# Patient Record
Sex: Male | Born: 1955 | Hispanic: No | Marital: Single | Smoking: Former smoker
Health system: Southern US, Community
[De-identification: ages and names within clinical notes are randomized; demographics above are authoritative.]

## PROBLEM LIST (undated history)

## (undated) DIAGNOSIS — E1169 Type 2 diabetes mellitus with other specified complication: Secondary | ICD-10-CM

## (undated) DIAGNOSIS — Z6834 Body mass index (BMI) 34.0-34.9, adult: Secondary | ICD-10-CM

## (undated) DIAGNOSIS — I10 Essential (primary) hypertension: Secondary | ICD-10-CM

## (undated) DIAGNOSIS — Z66 Do not resuscitate: Secondary | ICD-10-CM

## (undated) DIAGNOSIS — G894 Chronic pain syndrome: Secondary | ICD-10-CM

## (undated) DIAGNOSIS — G808 Other cerebral palsy: Secondary | ICD-10-CM

## (undated) DIAGNOSIS — G40909 Epilepsy, unspecified, not intractable, without status epilepticus: Secondary | ICD-10-CM

## (undated) DIAGNOSIS — F418 Other specified anxiety disorders: Secondary | ICD-10-CM

## (undated) DIAGNOSIS — E669 Obesity, unspecified: Secondary | ICD-10-CM

## (undated) DIAGNOSIS — E66811 Obesity, class 1: Secondary | ICD-10-CM

## (undated) HISTORY — DX: Type 2 diabetes mellitus with other specified complication: E11.69

---

## 2016-02-06 DIAGNOSIS — G4700 Insomnia, unspecified: Secondary | ICD-10-CM | POA: Diagnosis not present

## 2016-02-06 DIAGNOSIS — G809 Cerebral palsy, unspecified: Secondary | ICD-10-CM | POA: Diagnosis not present

## 2016-02-06 DIAGNOSIS — I10 Essential (primary) hypertension: Secondary | ICD-10-CM | POA: Diagnosis not present

## 2016-02-06 DIAGNOSIS — M79674 Pain in right toe(s): Secondary | ICD-10-CM | POA: Diagnosis not present

## 2016-02-06 DIAGNOSIS — M4802 Spinal stenosis, cervical region: Secondary | ICD-10-CM | POA: Diagnosis not present

## 2016-02-06 DIAGNOSIS — F329 Major depressive disorder, single episode, unspecified: Secondary | ICD-10-CM | POA: Diagnosis not present

## 2016-02-06 DIAGNOSIS — B351 Tinea unguium: Secondary | ICD-10-CM | POA: Diagnosis not present

## 2016-02-06 DIAGNOSIS — G8929 Other chronic pain: Secondary | ICD-10-CM | POA: Diagnosis not present

## 2016-02-06 DIAGNOSIS — K219 Gastro-esophageal reflux disease without esophagitis: Secondary | ICD-10-CM | POA: Diagnosis not present

## 2016-02-06 DIAGNOSIS — J449 Chronic obstructive pulmonary disease, unspecified: Secondary | ICD-10-CM | POA: Diagnosis not present

## 2016-02-06 DIAGNOSIS — R05 Cough: Secondary | ICD-10-CM | POA: Diagnosis not present

## 2016-02-06 DIAGNOSIS — R0989 Other specified symptoms and signs involving the circulatory and respiratory systems: Secondary | ICD-10-CM | POA: Diagnosis not present

## 2016-02-06 DIAGNOSIS — L409 Psoriasis, unspecified: Secondary | ICD-10-CM | POA: Diagnosis not present

## 2016-02-06 DIAGNOSIS — Z4789 Encounter for other orthopedic aftercare: Secondary | ICD-10-CM | POA: Diagnosis not present

## 2016-02-06 DIAGNOSIS — F419 Anxiety disorder, unspecified: Secondary | ICD-10-CM | POA: Diagnosis not present

## 2016-02-06 DIAGNOSIS — R200 Anesthesia of skin: Secondary | ICD-10-CM | POA: Diagnosis not present

## 2016-02-06 DIAGNOSIS — Z87891 Personal history of nicotine dependence: Secondary | ICD-10-CM | POA: Diagnosis not present

## 2016-02-06 DIAGNOSIS — R262 Difficulty in walking, not elsewhere classified: Secondary | ICD-10-CM | POA: Diagnosis not present

## 2016-02-06 DIAGNOSIS — R509 Fever, unspecified: Secondary | ICD-10-CM | POA: Diagnosis not present

## 2016-02-06 DIAGNOSIS — Z09 Encounter for follow-up examination after completed treatment for conditions other than malignant neoplasm: Secondary | ICD-10-CM | POA: Diagnosis not present

## 2016-02-06 DIAGNOSIS — M62838 Other muscle spasm: Secondary | ICD-10-CM | POA: Diagnosis not present

## 2016-02-06 DIAGNOSIS — M79675 Pain in left toe(s): Secondary | ICD-10-CM | POA: Diagnosis not present

## 2016-02-06 DIAGNOSIS — M1990 Unspecified osteoarthritis, unspecified site: Secondary | ICD-10-CM | POA: Diagnosis not present

## 2016-02-09 DIAGNOSIS — G809 Cerebral palsy, unspecified: Secondary | ICD-10-CM | POA: Diagnosis not present

## 2016-02-09 DIAGNOSIS — M62838 Other muscle spasm: Secondary | ICD-10-CM | POA: Diagnosis not present

## 2016-02-09 DIAGNOSIS — M4802 Spinal stenosis, cervical region: Secondary | ICD-10-CM | POA: Diagnosis not present

## 2016-02-16 DIAGNOSIS — F329 Major depressive disorder, single episode, unspecified: Secondary | ICD-10-CM | POA: Diagnosis not present

## 2016-02-16 DIAGNOSIS — F419 Anxiety disorder, unspecified: Secondary | ICD-10-CM | POA: Diagnosis not present

## 2016-02-16 DIAGNOSIS — R200 Anesthesia of skin: Secondary | ICD-10-CM | POA: Diagnosis not present

## 2016-02-16 DIAGNOSIS — M62838 Other muscle spasm: Secondary | ICD-10-CM | POA: Diagnosis not present

## 2016-02-16 DIAGNOSIS — G4700 Insomnia, unspecified: Secondary | ICD-10-CM | POA: Diagnosis not present

## 2016-02-16 DIAGNOSIS — M4802 Spinal stenosis, cervical region: Secondary | ICD-10-CM | POA: Diagnosis not present

## 2016-02-16 DIAGNOSIS — G809 Cerebral palsy, unspecified: Secondary | ICD-10-CM | POA: Diagnosis not present

## 2016-02-20 DIAGNOSIS — G8929 Other chronic pain: Secondary | ICD-10-CM | POA: Diagnosis not present

## 2016-02-20 DIAGNOSIS — G809 Cerebral palsy, unspecified: Secondary | ICD-10-CM | POA: Diagnosis not present

## 2016-02-20 DIAGNOSIS — M62838 Other muscle spasm: Secondary | ICD-10-CM | POA: Diagnosis not present

## 2016-02-23 DIAGNOSIS — R0989 Other specified symptoms and signs involving the circulatory and respiratory systems: Secondary | ICD-10-CM | POA: Diagnosis not present

## 2016-02-23 DIAGNOSIS — R05 Cough: Secondary | ICD-10-CM | POA: Diagnosis not present

## 2016-02-23 DIAGNOSIS — G809 Cerebral palsy, unspecified: Secondary | ICD-10-CM | POA: Diagnosis not present

## 2016-02-23 DIAGNOSIS — Z87891 Personal history of nicotine dependence: Secondary | ICD-10-CM | POA: Diagnosis not present

## 2016-03-06 DIAGNOSIS — Z09 Encounter for follow-up examination after completed treatment for conditions other than malignant neoplasm: Secondary | ICD-10-CM | POA: Diagnosis not present

## 2016-03-12 DIAGNOSIS — M62838 Other muscle spasm: Secondary | ICD-10-CM | POA: Diagnosis not present

## 2016-03-12 DIAGNOSIS — G809 Cerebral palsy, unspecified: Secondary | ICD-10-CM | POA: Diagnosis not present

## 2016-03-12 DIAGNOSIS — G8929 Other chronic pain: Secondary | ICD-10-CM | POA: Diagnosis not present

## 2016-03-12 DIAGNOSIS — M4802 Spinal stenosis, cervical region: Secondary | ICD-10-CM | POA: Diagnosis not present

## 2016-03-26 DIAGNOSIS — G809 Cerebral palsy, unspecified: Secondary | ICD-10-CM | POA: Diagnosis not present

## 2016-03-26 DIAGNOSIS — M62838 Other muscle spasm: Secondary | ICD-10-CM | POA: Diagnosis not present

## 2016-04-02 DIAGNOSIS — M4802 Spinal stenosis, cervical region: Secondary | ICD-10-CM | POA: Diagnosis not present

## 2016-04-02 DIAGNOSIS — M62838 Other muscle spasm: Secondary | ICD-10-CM | POA: Diagnosis not present

## 2016-04-02 DIAGNOSIS — G8929 Other chronic pain: Secondary | ICD-10-CM | POA: Diagnosis not present

## 2016-04-02 DIAGNOSIS — G809 Cerebral palsy, unspecified: Secondary | ICD-10-CM | POA: Diagnosis not present

## 2016-04-16 DIAGNOSIS — G809 Cerebral palsy, unspecified: Secondary | ICD-10-CM | POA: Diagnosis not present

## 2016-04-16 DIAGNOSIS — M4802 Spinal stenosis, cervical region: Secondary | ICD-10-CM | POA: Diagnosis not present

## 2016-04-16 DIAGNOSIS — G8929 Other chronic pain: Secondary | ICD-10-CM | POA: Diagnosis not present

## 2016-04-17 DIAGNOSIS — F329 Major depressive disorder, single episode, unspecified: Secondary | ICD-10-CM | POA: Diagnosis not present

## 2016-04-17 DIAGNOSIS — F419 Anxiety disorder, unspecified: Secondary | ICD-10-CM | POA: Diagnosis not present

## 2016-04-23 DIAGNOSIS — M4802 Spinal stenosis, cervical region: Secondary | ICD-10-CM | POA: Diagnosis not present

## 2016-04-23 DIAGNOSIS — M62838 Other muscle spasm: Secondary | ICD-10-CM | POA: Diagnosis not present

## 2016-04-23 DIAGNOSIS — G809 Cerebral palsy, unspecified: Secondary | ICD-10-CM | POA: Diagnosis not present

## 2016-04-26 DIAGNOSIS — R05 Cough: Secondary | ICD-10-CM | POA: Diagnosis not present

## 2016-04-26 DIAGNOSIS — R5381 Other malaise: Secondary | ICD-10-CM | POA: Diagnosis not present

## 2016-04-26 DIAGNOSIS — R0989 Other specified symptoms and signs involving the circulatory and respiratory systems: Secondary | ICD-10-CM | POA: Diagnosis not present

## 2016-04-26 DIAGNOSIS — R0602 Shortness of breath: Secondary | ICD-10-CM | POA: Diagnosis not present

## 2016-05-08 DIAGNOSIS — F419 Anxiety disorder, unspecified: Secondary | ICD-10-CM | POA: Diagnosis not present

## 2016-05-08 DIAGNOSIS — F329 Major depressive disorder, single episode, unspecified: Secondary | ICD-10-CM | POA: Diagnosis not present

## 2016-05-09 DIAGNOSIS — R262 Difficulty in walking, not elsewhere classified: Secondary | ICD-10-CM | POA: Diagnosis not present

## 2016-05-09 DIAGNOSIS — M79675 Pain in left toe(s): Secondary | ICD-10-CM | POA: Diagnosis not present

## 2016-05-09 DIAGNOSIS — B351 Tinea unguium: Secondary | ICD-10-CM | POA: Diagnosis not present

## 2016-05-09 DIAGNOSIS — M79674 Pain in right toe(s): Secondary | ICD-10-CM | POA: Diagnosis not present

## 2016-05-10 DIAGNOSIS — G4700 Insomnia, unspecified: Secondary | ICD-10-CM | POA: Diagnosis not present

## 2016-05-10 DIAGNOSIS — Z961 Presence of intraocular lens: Secondary | ICD-10-CM | POA: Diagnosis not present

## 2016-05-10 DIAGNOSIS — F419 Anxiety disorder, unspecified: Secondary | ICD-10-CM | POA: Diagnosis not present

## 2016-05-10 DIAGNOSIS — F329 Major depressive disorder, single episode, unspecified: Secondary | ICD-10-CM | POA: Diagnosis not present

## 2016-05-22 DIAGNOSIS — F419 Anxiety disorder, unspecified: Secondary | ICD-10-CM | POA: Diagnosis not present

## 2016-05-22 DIAGNOSIS — F329 Major depressive disorder, single episode, unspecified: Secondary | ICD-10-CM | POA: Diagnosis not present

## 2016-05-29 DIAGNOSIS — M4802 Spinal stenosis, cervical region: Secondary | ICD-10-CM | POA: Diagnosis not present

## 2016-05-29 DIAGNOSIS — Z720 Tobacco use: Secondary | ICD-10-CM | POA: Diagnosis not present

## 2016-05-29 DIAGNOSIS — F329 Major depressive disorder, single episode, unspecified: Secondary | ICD-10-CM | POA: Diagnosis not present

## 2016-05-29 DIAGNOSIS — G8929 Other chronic pain: Secondary | ICD-10-CM | POA: Diagnosis not present

## 2016-05-31 DIAGNOSIS — M4802 Spinal stenosis, cervical region: Secondary | ICD-10-CM | POA: Diagnosis not present

## 2016-05-31 DIAGNOSIS — Z720 Tobacco use: Secondary | ICD-10-CM | POA: Diagnosis not present

## 2016-05-31 DIAGNOSIS — G8929 Other chronic pain: Secondary | ICD-10-CM | POA: Diagnosis not present

## 2016-05-31 DIAGNOSIS — M62838 Other muscle spasm: Secondary | ICD-10-CM | POA: Diagnosis not present

## 2016-06-04 DIAGNOSIS — G809 Cerebral palsy, unspecified: Secondary | ICD-10-CM | POA: Diagnosis not present

## 2016-06-04 DIAGNOSIS — M62838 Other muscle spasm: Secondary | ICD-10-CM | POA: Diagnosis not present

## 2016-06-04 DIAGNOSIS — Z720 Tobacco use: Secondary | ICD-10-CM | POA: Diagnosis not present

## 2016-06-05 DIAGNOSIS — F329 Major depressive disorder, single episode, unspecified: Secondary | ICD-10-CM | POA: Diagnosis not present

## 2016-06-05 DIAGNOSIS — F419 Anxiety disorder, unspecified: Secondary | ICD-10-CM | POA: Diagnosis not present

## 2016-06-11 DIAGNOSIS — G809 Cerebral palsy, unspecified: Secondary | ICD-10-CM | POA: Diagnosis not present

## 2016-06-11 DIAGNOSIS — M62838 Other muscle spasm: Secondary | ICD-10-CM | POA: Diagnosis not present

## 2016-06-11 DIAGNOSIS — M4802 Spinal stenosis, cervical region: Secondary | ICD-10-CM | POA: Diagnosis not present

## 2016-06-11 DIAGNOSIS — G8929 Other chronic pain: Secondary | ICD-10-CM | POA: Diagnosis not present

## 2016-06-18 DIAGNOSIS — Z9119 Patient's noncompliance with other medical treatment and regimen: Secondary | ICD-10-CM | POA: Diagnosis not present

## 2016-06-18 DIAGNOSIS — Z720 Tobacco use: Secondary | ICD-10-CM | POA: Diagnosis not present

## 2016-06-18 DIAGNOSIS — J449 Chronic obstructive pulmonary disease, unspecified: Secondary | ICD-10-CM | POA: Diagnosis not present

## 2016-06-21 DIAGNOSIS — F329 Major depressive disorder, single episode, unspecified: Secondary | ICD-10-CM | POA: Diagnosis not present

## 2016-06-21 DIAGNOSIS — G4700 Insomnia, unspecified: Secondary | ICD-10-CM | POA: Diagnosis not present

## 2016-06-21 DIAGNOSIS — F419 Anxiety disorder, unspecified: Secondary | ICD-10-CM | POA: Diagnosis not present

## 2016-06-26 DIAGNOSIS — M542 Cervicalgia: Secondary | ICD-10-CM | POA: Diagnosis not present

## 2016-06-26 DIAGNOSIS — G959 Disease of spinal cord, unspecified: Secondary | ICD-10-CM | POA: Diagnosis not present

## 2016-07-04 DIAGNOSIS — F419 Anxiety disorder, unspecified: Secondary | ICD-10-CM | POA: Diagnosis not present

## 2016-07-04 DIAGNOSIS — F329 Major depressive disorder, single episode, unspecified: Secondary | ICD-10-CM | POA: Diagnosis not present

## 2016-07-10 DIAGNOSIS — I10 Essential (primary) hypertension: Secondary | ICD-10-CM | POA: Diagnosis not present

## 2016-07-16 DIAGNOSIS — G809 Cerebral palsy, unspecified: Secondary | ICD-10-CM | POA: Diagnosis not present

## 2016-07-16 DIAGNOSIS — M4802 Spinal stenosis, cervical region: Secondary | ICD-10-CM | POA: Diagnosis not present

## 2016-07-16 DIAGNOSIS — M62838 Other muscle spasm: Secondary | ICD-10-CM | POA: Diagnosis not present

## 2016-07-16 DIAGNOSIS — G8929 Other chronic pain: Secondary | ICD-10-CM | POA: Diagnosis not present

## 2016-07-19 DIAGNOSIS — L209 Atopic dermatitis, unspecified: Secondary | ICD-10-CM | POA: Diagnosis not present

## 2016-07-24 DIAGNOSIS — F329 Major depressive disorder, single episode, unspecified: Secondary | ICD-10-CM | POA: Diagnosis not present

## 2016-07-24 DIAGNOSIS — F419 Anxiety disorder, unspecified: Secondary | ICD-10-CM | POA: Diagnosis not present

## 2016-07-26 DIAGNOSIS — L739 Follicular disorder, unspecified: Secondary | ICD-10-CM | POA: Diagnosis not present

## 2016-08-02 DIAGNOSIS — F419 Anxiety disorder, unspecified: Secondary | ICD-10-CM | POA: Diagnosis not present

## 2016-08-02 DIAGNOSIS — M62838 Other muscle spasm: Secondary | ICD-10-CM | POA: Diagnosis not present

## 2016-08-02 DIAGNOSIS — G4700 Insomnia, unspecified: Secondary | ICD-10-CM | POA: Diagnosis not present

## 2016-08-02 DIAGNOSIS — R21 Rash and other nonspecific skin eruption: Secondary | ICD-10-CM | POA: Diagnosis not present

## 2016-08-02 DIAGNOSIS — F329 Major depressive disorder, single episode, unspecified: Secondary | ICD-10-CM | POA: Diagnosis not present

## 2016-08-07 DIAGNOSIS — J449 Chronic obstructive pulmonary disease, unspecified: Secondary | ICD-10-CM | POA: Diagnosis not present

## 2016-08-07 DIAGNOSIS — I10 Essential (primary) hypertension: Secondary | ICD-10-CM | POA: Diagnosis not present

## 2016-08-07 DIAGNOSIS — F419 Anxiety disorder, unspecified: Secondary | ICD-10-CM | POA: Diagnosis not present

## 2016-08-07 DIAGNOSIS — F329 Major depressive disorder, single episode, unspecified: Secondary | ICD-10-CM | POA: Diagnosis not present

## 2016-08-21 DIAGNOSIS — F329 Major depressive disorder, single episode, unspecified: Secondary | ICD-10-CM | POA: Diagnosis not present

## 2016-08-21 DIAGNOSIS — F419 Anxiety disorder, unspecified: Secondary | ICD-10-CM | POA: Diagnosis not present

## 2016-08-29 DIAGNOSIS — I10 Essential (primary) hypertension: Secondary | ICD-10-CM | POA: Diagnosis not present

## 2016-09-12 DIAGNOSIS — F329 Major depressive disorder, single episode, unspecified: Secondary | ICD-10-CM | POA: Diagnosis not present

## 2016-09-12 DIAGNOSIS — F419 Anxiety disorder, unspecified: Secondary | ICD-10-CM | POA: Diagnosis not present

## 2016-09-12 DIAGNOSIS — G4700 Insomnia, unspecified: Secondary | ICD-10-CM | POA: Diagnosis not present

## 2016-09-18 DIAGNOSIS — F330 Major depressive disorder, recurrent, mild: Secondary | ICD-10-CM | POA: Diagnosis not present

## 2016-09-18 DIAGNOSIS — F419 Anxiety disorder, unspecified: Secondary | ICD-10-CM | POA: Diagnosis not present

## 2016-10-09 DIAGNOSIS — F419 Anxiety disorder, unspecified: Secondary | ICD-10-CM | POA: Diagnosis not present

## 2016-10-09 DIAGNOSIS — F330 Major depressive disorder, recurrent, mild: Secondary | ICD-10-CM | POA: Diagnosis not present

## 2016-10-09 DIAGNOSIS — Z043 Encounter for examination and observation following other accident: Secondary | ICD-10-CM | POA: Diagnosis not present

## 2016-11-09 DIAGNOSIS — F419 Anxiety disorder, unspecified: Secondary | ICD-10-CM | POA: Diagnosis not present

## 2016-11-09 DIAGNOSIS — F330 Major depressive disorder, recurrent, mild: Secondary | ICD-10-CM | POA: Diagnosis not present

## 2016-11-09 DIAGNOSIS — G4700 Insomnia, unspecified: Secondary | ICD-10-CM | POA: Diagnosis not present

## 2016-11-12 DIAGNOSIS — G4700 Insomnia, unspecified: Secondary | ICD-10-CM | POA: Diagnosis not present

## 2016-11-12 DIAGNOSIS — F329 Major depressive disorder, single episode, unspecified: Secondary | ICD-10-CM | POA: Diagnosis not present

## 2016-11-12 DIAGNOSIS — K219 Gastro-esophageal reflux disease without esophagitis: Secondary | ICD-10-CM | POA: Diagnosis not present

## 2016-11-12 DIAGNOSIS — G809 Cerebral palsy, unspecified: Secondary | ICD-10-CM | POA: Diagnosis not present

## 2016-11-12 DIAGNOSIS — J449 Chronic obstructive pulmonary disease, unspecified: Secondary | ICD-10-CM | POA: Diagnosis not present

## 2016-11-12 DIAGNOSIS — F419 Anxiety disorder, unspecified: Secondary | ICD-10-CM | POA: Diagnosis not present

## 2016-11-12 DIAGNOSIS — I10 Essential (primary) hypertension: Secondary | ICD-10-CM | POA: Diagnosis not present

## 2016-11-12 DIAGNOSIS — M4802 Spinal stenosis, cervical region: Secondary | ICD-10-CM | POA: Diagnosis not present

## 2016-11-13 DIAGNOSIS — F419 Anxiety disorder, unspecified: Secondary | ICD-10-CM | POA: Diagnosis not present

## 2016-11-13 DIAGNOSIS — F330 Major depressive disorder, recurrent, mild: Secondary | ICD-10-CM | POA: Diagnosis not present

## 2016-11-26 DIAGNOSIS — B86 Scabies: Secondary | ICD-10-CM | POA: Diagnosis not present

## 2016-11-26 DIAGNOSIS — R531 Weakness: Secondary | ICD-10-CM | POA: Diagnosis not present

## 2016-11-26 DIAGNOSIS — R279 Unspecified lack of coordination: Secondary | ICD-10-CM | POA: Diagnosis not present

## 2016-11-26 DIAGNOSIS — R21 Rash and other nonspecific skin eruption: Secondary | ICD-10-CM | POA: Diagnosis not present

## 2016-12-04 DIAGNOSIS — F419 Anxiety disorder, unspecified: Secondary | ICD-10-CM | POA: Diagnosis not present

## 2016-12-04 DIAGNOSIS — F330 Major depressive disorder, recurrent, mild: Secondary | ICD-10-CM | POA: Diagnosis not present

## 2016-12-18 DIAGNOSIS — F330 Major depressive disorder, recurrent, mild: Secondary | ICD-10-CM | POA: Diagnosis not present

## 2016-12-18 DIAGNOSIS — F419 Anxiety disorder, unspecified: Secondary | ICD-10-CM | POA: Diagnosis not present

## 2016-12-19 DIAGNOSIS — R279 Unspecified lack of coordination: Secondary | ICD-10-CM | POA: Diagnosis not present

## 2016-12-19 DIAGNOSIS — B86 Scabies: Secondary | ICD-10-CM | POA: Diagnosis not present

## 2016-12-19 DIAGNOSIS — R531 Weakness: Secondary | ICD-10-CM | POA: Diagnosis not present

## 2017-01-07 DIAGNOSIS — I10 Essential (primary) hypertension: Secondary | ICD-10-CM | POA: Diagnosis not present

## 2017-01-12 DIAGNOSIS — R062 Wheezing: Secondary | ICD-10-CM | POA: Diagnosis not present

## 2017-01-15 DIAGNOSIS — L218 Other seborrheic dermatitis: Secondary | ICD-10-CM | POA: Diagnosis not present

## 2017-01-15 DIAGNOSIS — R279 Unspecified lack of coordination: Secondary | ICD-10-CM | POA: Diagnosis not present

## 2017-01-15 DIAGNOSIS — R531 Weakness: Secondary | ICD-10-CM | POA: Diagnosis not present

## 2017-01-15 DIAGNOSIS — B86 Scabies: Secondary | ICD-10-CM | POA: Diagnosis not present

## 2017-01-25 DIAGNOSIS — Z7401 Bed confinement status: Secondary | ICD-10-CM | POA: Diagnosis not present

## 2017-01-25 DIAGNOSIS — R52 Pain, unspecified: Secondary | ICD-10-CM | POA: Diagnosis not present

## 2017-01-25 DIAGNOSIS — G8220 Paraplegia, unspecified: Secondary | ICD-10-CM | POA: Diagnosis not present

## 2017-01-31 DIAGNOSIS — R5383 Other fatigue: Secondary | ICD-10-CM | POA: Diagnosis not present

## 2017-01-31 DIAGNOSIS — R4182 Altered mental status, unspecified: Secondary | ICD-10-CM | POA: Diagnosis not present

## 2017-01-31 DIAGNOSIS — R0602 Shortness of breath: Secondary | ICD-10-CM | POA: Diagnosis not present

## 2017-01-31 DIAGNOSIS — R001 Bradycardia, unspecified: Secondary | ICD-10-CM | POA: Diagnosis not present

## 2017-01-31 DIAGNOSIS — Z981 Arthrodesis status: Secondary | ICD-10-CM | POA: Diagnosis not present

## 2017-01-31 DIAGNOSIS — R7981 Abnormal blood-gas level: Secondary | ICD-10-CM | POA: Diagnosis not present

## 2017-02-01 DIAGNOSIS — R4182 Altered mental status, unspecified: Secondary | ICD-10-CM | POA: Diagnosis not present

## 2017-02-01 DIAGNOSIS — R031 Nonspecific low blood-pressure reading: Secondary | ICD-10-CM | POA: Diagnosis not present

## 2017-02-01 DIAGNOSIS — N179 Acute kidney failure, unspecified: Secondary | ICD-10-CM | POA: Diagnosis not present

## 2017-02-01 DIAGNOSIS — E860 Dehydration: Secondary | ICD-10-CM | POA: Diagnosis not present

## 2017-02-01 DIAGNOSIS — J9601 Acute respiratory failure with hypoxia: Secondary | ICD-10-CM | POA: Diagnosis not present

## 2017-02-01 DIAGNOSIS — E875 Hyperkalemia: Secondary | ICD-10-CM | POA: Diagnosis not present

## 2017-02-01 DIAGNOSIS — I959 Hypotension, unspecified: Secondary | ICD-10-CM | POA: Diagnosis not present

## 2017-02-03 DIAGNOSIS — G808 Other cerebral palsy: Secondary | ICD-10-CM | POA: Diagnosis not present

## 2017-02-03 DIAGNOSIS — J9602 Acute respiratory failure with hypercapnia: Secondary | ICD-10-CM | POA: Diagnosis not present

## 2017-02-03 DIAGNOSIS — K219 Gastro-esophageal reflux disease without esophagitis: Secondary | ICD-10-CM | POA: Diagnosis not present

## 2017-02-03 DIAGNOSIS — G959 Disease of spinal cord, unspecified: Secondary | ICD-10-CM | POA: Diagnosis not present

## 2017-02-03 DIAGNOSIS — I10 Essential (primary) hypertension: Secondary | ICD-10-CM | POA: Diagnosis not present

## 2017-02-03 DIAGNOSIS — N179 Acute kidney failure, unspecified: Secondary | ICD-10-CM | POA: Diagnosis not present

## 2017-02-04 DIAGNOSIS — N179 Acute kidney failure, unspecified: Secondary | ICD-10-CM | POA: Diagnosis not present

## 2017-02-04 DIAGNOSIS — G808 Other cerebral palsy: Secondary | ICD-10-CM | POA: Diagnosis not present

## 2017-02-04 DIAGNOSIS — I10 Essential (primary) hypertension: Secondary | ICD-10-CM | POA: Diagnosis not present

## 2017-02-04 DIAGNOSIS — K219 Gastro-esophageal reflux disease without esophagitis: Secondary | ICD-10-CM | POA: Diagnosis not present

## 2017-02-04 DIAGNOSIS — J9602 Acute respiratory failure with hypercapnia: Secondary | ICD-10-CM | POA: Diagnosis not present

## 2017-02-04 DIAGNOSIS — M5000 Cervical disc disorder with myelopathy, unspecified cervical region: Secondary | ICD-10-CM | POA: Diagnosis not present

## 2017-02-05 DIAGNOSIS — R251 Tremor, unspecified: Secondary | ICD-10-CM | POA: Diagnosis not present

## 2017-02-05 DIAGNOSIS — K5901 Slow transit constipation: Secondary | ICD-10-CM | POA: Diagnosis not present

## 2017-02-05 DIAGNOSIS — F17210 Nicotine dependence, cigarettes, uncomplicated: Secondary | ICD-10-CM | POA: Diagnosis not present

## 2017-02-05 DIAGNOSIS — I10 Essential (primary) hypertension: Secondary | ICD-10-CM | POA: Diagnosis not present

## 2017-02-05 DIAGNOSIS — J441 Chronic obstructive pulmonary disease with (acute) exacerbation: Secondary | ICD-10-CM | POA: Diagnosis not present

## 2017-02-05 DIAGNOSIS — R1310 Dysphagia, unspecified: Secondary | ICD-10-CM | POA: Diagnosis not present

## 2017-02-05 DIAGNOSIS — G801 Spastic diplegic cerebral palsy: Secondary | ICD-10-CM | POA: Diagnosis not present

## 2017-02-05 DIAGNOSIS — N179 Acute kidney failure, unspecified: Secondary | ICD-10-CM | POA: Diagnosis not present

## 2017-02-05 DIAGNOSIS — K219 Gastro-esophageal reflux disease without esophagitis: Secondary | ICD-10-CM | POA: Diagnosis not present

## 2017-02-05 DIAGNOSIS — G969 Disorder of central nervous system, unspecified: Secondary | ICD-10-CM | POA: Diagnosis not present

## 2017-02-05 DIAGNOSIS — G809 Cerebral palsy, unspecified: Secondary | ICD-10-CM | POA: Diagnosis not present

## 2017-02-05 DIAGNOSIS — J9602 Acute respiratory failure with hypercapnia: Secondary | ICD-10-CM | POA: Diagnosis not present

## 2017-02-06 DIAGNOSIS — J441 Chronic obstructive pulmonary disease with (acute) exacerbation: Secondary | ICD-10-CM | POA: Diagnosis not present

## 2017-02-06 DIAGNOSIS — K219 Gastro-esophageal reflux disease without esophagitis: Secondary | ICD-10-CM | POA: Diagnosis not present

## 2017-02-06 DIAGNOSIS — K5901 Slow transit constipation: Secondary | ICD-10-CM | POA: Diagnosis not present

## 2017-02-06 DIAGNOSIS — J9602 Acute respiratory failure with hypercapnia: Secondary | ICD-10-CM | POA: Diagnosis not present

## 2017-02-06 DIAGNOSIS — I10 Essential (primary) hypertension: Secondary | ICD-10-CM | POA: Diagnosis not present

## 2017-02-06 DIAGNOSIS — F17210 Nicotine dependence, cigarettes, uncomplicated: Secondary | ICD-10-CM | POA: Diagnosis not present

## 2017-02-06 DIAGNOSIS — N179 Acute kidney failure, unspecified: Secondary | ICD-10-CM | POA: Diagnosis not present

## 2017-02-06 DIAGNOSIS — R1310 Dysphagia, unspecified: Secondary | ICD-10-CM | POA: Diagnosis not present

## 2017-02-06 DIAGNOSIS — G809 Cerebral palsy, unspecified: Secondary | ICD-10-CM | POA: Diagnosis not present

## 2017-02-07 DIAGNOSIS — K5901 Slow transit constipation: Secondary | ICD-10-CM | POA: Diagnosis not present

## 2017-02-07 DIAGNOSIS — I10 Essential (primary) hypertension: Secondary | ICD-10-CM | POA: Diagnosis not present

## 2017-02-07 DIAGNOSIS — J441 Chronic obstructive pulmonary disease with (acute) exacerbation: Secondary | ICD-10-CM | POA: Diagnosis not present

## 2017-02-07 DIAGNOSIS — G809 Cerebral palsy, unspecified: Secondary | ICD-10-CM | POA: Diagnosis not present

## 2017-02-07 DIAGNOSIS — N179 Acute kidney failure, unspecified: Secondary | ICD-10-CM | POA: Diagnosis not present

## 2017-02-07 DIAGNOSIS — R1310 Dysphagia, unspecified: Secondary | ICD-10-CM | POA: Diagnosis not present

## 2017-02-07 DIAGNOSIS — F17210 Nicotine dependence, cigarettes, uncomplicated: Secondary | ICD-10-CM | POA: Diagnosis not present

## 2017-02-07 DIAGNOSIS — K219 Gastro-esophageal reflux disease without esophagitis: Secondary | ICD-10-CM | POA: Diagnosis not present

## 2017-02-07 DIAGNOSIS — J9602 Acute respiratory failure with hypercapnia: Secondary | ICD-10-CM | POA: Diagnosis not present

## 2017-02-08 DIAGNOSIS — I10 Essential (primary) hypertension: Secondary | ICD-10-CM | POA: Diagnosis not present

## 2017-02-08 DIAGNOSIS — K5901 Slow transit constipation: Secondary | ICD-10-CM | POA: Diagnosis not present

## 2017-02-08 DIAGNOSIS — F17210 Nicotine dependence, cigarettes, uncomplicated: Secondary | ICD-10-CM | POA: Diagnosis not present

## 2017-02-08 DIAGNOSIS — R1310 Dysphagia, unspecified: Secondary | ICD-10-CM | POA: Diagnosis not present

## 2017-02-08 DIAGNOSIS — K219 Gastro-esophageal reflux disease without esophagitis: Secondary | ICD-10-CM | POA: Diagnosis not present

## 2017-02-08 DIAGNOSIS — J441 Chronic obstructive pulmonary disease with (acute) exacerbation: Secondary | ICD-10-CM | POA: Diagnosis not present

## 2017-02-08 DIAGNOSIS — G809 Cerebral palsy, unspecified: Secondary | ICD-10-CM | POA: Diagnosis not present

## 2017-02-08 DIAGNOSIS — N179 Acute kidney failure, unspecified: Secondary | ICD-10-CM | POA: Diagnosis not present

## 2017-02-08 DIAGNOSIS — J9602 Acute respiratory failure with hypercapnia: Secondary | ICD-10-CM | POA: Diagnosis not present

## 2017-02-09 DIAGNOSIS — R279 Unspecified lack of coordination: Secondary | ICD-10-CM | POA: Diagnosis not present

## 2017-02-09 DIAGNOSIS — R1310 Dysphagia, unspecified: Secondary | ICD-10-CM | POA: Diagnosis not present

## 2017-02-09 DIAGNOSIS — J9602 Acute respiratory failure with hypercapnia: Secondary | ICD-10-CM | POA: Diagnosis not present

## 2017-02-09 DIAGNOSIS — G809 Cerebral palsy, unspecified: Secondary | ICD-10-CM | POA: Diagnosis not present

## 2017-02-09 DIAGNOSIS — J441 Chronic obstructive pulmonary disease with (acute) exacerbation: Secondary | ICD-10-CM | POA: Diagnosis not present

## 2017-02-09 DIAGNOSIS — Z743 Need for continuous supervision: Secondary | ICD-10-CM | POA: Diagnosis not present

## 2017-02-09 DIAGNOSIS — F17210 Nicotine dependence, cigarettes, uncomplicated: Secondary | ICD-10-CM | POA: Diagnosis not present

## 2017-02-09 DIAGNOSIS — I10 Essential (primary) hypertension: Secondary | ICD-10-CM | POA: Diagnosis not present

## 2017-02-09 DIAGNOSIS — K219 Gastro-esophageal reflux disease without esophagitis: Secondary | ICD-10-CM | POA: Diagnosis not present

## 2017-02-09 DIAGNOSIS — N179 Acute kidney failure, unspecified: Secondary | ICD-10-CM | POA: Diagnosis not present

## 2017-02-09 DIAGNOSIS — K5901 Slow transit constipation: Secondary | ICD-10-CM | POA: Diagnosis not present

## 2017-03-04 DIAGNOSIS — B351 Tinea unguium: Secondary | ICD-10-CM | POA: Diagnosis not present

## 2017-03-04 DIAGNOSIS — G809 Cerebral palsy, unspecified: Secondary | ICD-10-CM | POA: Diagnosis not present

## 2017-03-04 DIAGNOSIS — R262 Difficulty in walking, not elsewhere classified: Secondary | ICD-10-CM | POA: Diagnosis not present

## 2017-03-26 DIAGNOSIS — G959 Disease of spinal cord, unspecified: Secondary | ICD-10-CM | POA: Diagnosis not present

## 2017-03-26 DIAGNOSIS — G253 Myoclonus: Secondary | ICD-10-CM | POA: Diagnosis not present

## 2017-03-26 DIAGNOSIS — G808 Other cerebral palsy: Secondary | ICD-10-CM | POA: Diagnosis not present

## 2017-05-17 DIAGNOSIS — G253 Myoclonus: Secondary | ICD-10-CM | POA: Diagnosis not present

## 2017-06-07 DIAGNOSIS — R531 Weakness: Secondary | ICD-10-CM | POA: Diagnosis not present

## 2017-06-07 DIAGNOSIS — R52 Pain, unspecified: Secondary | ICD-10-CM | POA: Diagnosis not present

## 2017-06-07 DIAGNOSIS — R269 Unspecified abnormalities of gait and mobility: Secondary | ICD-10-CM | POA: Diagnosis not present

## 2017-10-13 DIAGNOSIS — J449 Chronic obstructive pulmonary disease, unspecified: Secondary | ICD-10-CM | POA: Diagnosis not present

## 2017-10-13 DIAGNOSIS — Z9911 Dependence on respirator [ventilator] status: Secondary | ICD-10-CM | POA: Diagnosis not present

## 2017-10-13 DIAGNOSIS — Z993 Dependence on wheelchair: Secondary | ICD-10-CM | POA: Diagnosis not present

## 2017-10-13 DIAGNOSIS — L89151 Pressure ulcer of sacral region, stage 1: Secondary | ICD-10-CM | POA: Diagnosis not present

## 2017-10-13 DIAGNOSIS — A419 Sepsis, unspecified organism: Secondary | ICD-10-CM | POA: Diagnosis not present

## 2017-10-13 DIAGNOSIS — D72825 Bandemia: Secondary | ICD-10-CM | POA: Diagnosis not present

## 2017-10-13 DIAGNOSIS — G809 Cerebral palsy, unspecified: Secondary | ICD-10-CM | POA: Diagnosis not present

## 2017-10-13 DIAGNOSIS — J9602 Acute respiratory failure with hypercapnia: Secondary | ICD-10-CM | POA: Diagnosis not present

## 2017-10-13 DIAGNOSIS — L03119 Cellulitis of unspecified part of limb: Secondary | ICD-10-CM | POA: Diagnosis not present

## 2017-10-13 DIAGNOSIS — J181 Lobar pneumonia, unspecified organism: Secondary | ICD-10-CM | POA: Diagnosis not present

## 2017-10-13 DIAGNOSIS — R6520 Severe sepsis without septic shock: Secondary | ICD-10-CM | POA: Diagnosis not present

## 2017-10-13 DIAGNOSIS — R4182 Altered mental status, unspecified: Secondary | ICD-10-CM | POA: Diagnosis not present

## 2017-10-13 DIAGNOSIS — R6521 Severe sepsis with septic shock: Secondary | ICD-10-CM | POA: Diagnosis not present

## 2017-10-13 DIAGNOSIS — J9690 Respiratory failure, unspecified, unspecified whether with hypoxia or hypercapnia: Secondary | ICD-10-CM | POA: Diagnosis not present

## 2017-10-20 DIAGNOSIS — J9601 Acute respiratory failure with hypoxia: Secondary | ICD-10-CM | POA: Diagnosis not present

## 2017-10-20 DIAGNOSIS — A419 Sepsis, unspecified organism: Secondary | ICD-10-CM | POA: Diagnosis not present

## 2017-10-20 DIAGNOSIS — G808 Other cerebral palsy: Secondary | ICD-10-CM | POA: Diagnosis not present

## 2017-10-20 DIAGNOSIS — J189 Pneumonia, unspecified organism: Secondary | ICD-10-CM | POA: Diagnosis not present

## 2017-10-20 DIAGNOSIS — R6521 Severe sepsis with septic shock: Secondary | ICD-10-CM | POA: Diagnosis not present

## 2017-10-20 DIAGNOSIS — Z9911 Dependence on respirator [ventilator] status: Secondary | ICD-10-CM | POA: Diagnosis not present

## 2017-10-24 DIAGNOSIS — Z9981 Dependence on supplemental oxygen: Secondary | ICD-10-CM | POA: Diagnosis not present

## 2017-10-24 DIAGNOSIS — J9600 Acute respiratory failure, unspecified whether with hypoxia or hypercapnia: Secondary | ICD-10-CM | POA: Diagnosis not present

## 2017-10-24 DIAGNOSIS — G809 Cerebral palsy, unspecified: Secondary | ICD-10-CM | POA: Diagnosis not present

## 2017-10-24 DIAGNOSIS — J9602 Acute respiratory failure with hypercapnia: Secondary | ICD-10-CM | POA: Diagnosis not present

## 2017-10-24 DIAGNOSIS — Z66 Do not resuscitate: Secondary | ICD-10-CM | POA: Diagnosis not present

## 2017-10-24 DIAGNOSIS — Z993 Dependence on wheelchair: Secondary | ICD-10-CM | POA: Diagnosis not present

## 2017-10-24 DIAGNOSIS — J449 Chronic obstructive pulmonary disease, unspecified: Secondary | ICD-10-CM | POA: Diagnosis not present

## 2017-10-24 DIAGNOSIS — A419 Sepsis, unspecified organism: Secondary | ICD-10-CM | POA: Diagnosis not present

## 2017-10-24 DIAGNOSIS — R6521 Severe sepsis with septic shock: Secondary | ICD-10-CM | POA: Diagnosis not present

## 2017-10-25 DIAGNOSIS — J9809 Other diseases of bronchus, not elsewhere classified: Secondary | ICD-10-CM | POA: Diagnosis not present

## 2017-10-25 DIAGNOSIS — Z9911 Dependence on respirator [ventilator] status: Secondary | ICD-10-CM | POA: Diagnosis not present

## 2017-10-25 DIAGNOSIS — G8929 Other chronic pain: Secondary | ICD-10-CM | POA: Diagnosis not present

## 2017-10-25 DIAGNOSIS — Z452 Encounter for adjustment and management of vascular access device: Secondary | ICD-10-CM | POA: Diagnosis not present

## 2017-10-25 DIAGNOSIS — F411 Generalized anxiety disorder: Secondary | ICD-10-CM | POA: Diagnosis not present

## 2017-10-25 DIAGNOSIS — J449 Chronic obstructive pulmonary disease, unspecified: Secondary | ICD-10-CM | POA: Diagnosis not present

## 2017-10-25 DIAGNOSIS — F17210 Nicotine dependence, cigarettes, uncomplicated: Secondary | ICD-10-CM | POA: Diagnosis not present

## 2017-10-25 DIAGNOSIS — I10 Essential (primary) hypertension: Secondary | ICD-10-CM | POA: Diagnosis not present

## 2017-10-25 DIAGNOSIS — J9601 Acute respiratory failure with hypoxia: Secondary | ICD-10-CM | POA: Diagnosis not present

## 2017-10-25 DIAGNOSIS — J189 Pneumonia, unspecified organism: Secondary | ICD-10-CM | POA: Diagnosis not present

## 2017-10-26 DIAGNOSIS — J9601 Acute respiratory failure with hypoxia: Secondary | ICD-10-CM | POA: Diagnosis not present

## 2017-10-26 DIAGNOSIS — K5900 Constipation, unspecified: Secondary | ICD-10-CM | POA: Diagnosis not present

## 2017-10-26 DIAGNOSIS — A419 Sepsis, unspecified organism: Secondary | ICD-10-CM | POA: Diagnosis not present

## 2017-10-26 DIAGNOSIS — J189 Pneumonia, unspecified organism: Secondary | ICD-10-CM | POA: Diagnosis not present

## 2017-10-26 DIAGNOSIS — F411 Generalized anxiety disorder: Secondary | ICD-10-CM | POA: Diagnosis not present

## 2017-10-26 DIAGNOSIS — F17210 Nicotine dependence, cigarettes, uncomplicated: Secondary | ICD-10-CM | POA: Diagnosis not present

## 2017-10-26 DIAGNOSIS — J9809 Other diseases of bronchus, not elsewhere classified: Secondary | ICD-10-CM | POA: Diagnosis not present

## 2017-10-26 DIAGNOSIS — J9602 Acute respiratory failure with hypercapnia: Secondary | ICD-10-CM | POA: Diagnosis not present

## 2017-10-26 DIAGNOSIS — Z9911 Dependence on respirator [ventilator] status: Secondary | ICD-10-CM | POA: Diagnosis not present

## 2017-10-26 DIAGNOSIS — G809 Cerebral palsy, unspecified: Secondary | ICD-10-CM | POA: Diagnosis not present

## 2017-10-26 DIAGNOSIS — R6521 Severe sepsis with septic shock: Secondary | ICD-10-CM | POA: Diagnosis not present

## 2017-10-26 DIAGNOSIS — I10 Essential (primary) hypertension: Secondary | ICD-10-CM | POA: Diagnosis not present

## 2017-10-26 DIAGNOSIS — E119 Type 2 diabetes mellitus without complications: Secondary | ICD-10-CM | POA: Diagnosis not present

## 2017-10-27 DIAGNOSIS — G809 Cerebral palsy, unspecified: Secondary | ICD-10-CM | POA: Diagnosis not present

## 2017-10-27 DIAGNOSIS — J9601 Acute respiratory failure with hypoxia: Secondary | ICD-10-CM | POA: Diagnosis not present

## 2017-10-27 DIAGNOSIS — K5900 Constipation, unspecified: Secondary | ICD-10-CM | POA: Diagnosis not present

## 2017-10-27 DIAGNOSIS — J449 Chronic obstructive pulmonary disease, unspecified: Secondary | ICD-10-CM | POA: Diagnosis not present

## 2017-10-27 DIAGNOSIS — F411 Generalized anxiety disorder: Secondary | ICD-10-CM | POA: Diagnosis not present

## 2017-10-27 DIAGNOSIS — F17210 Nicotine dependence, cigarettes, uncomplicated: Secondary | ICD-10-CM | POA: Diagnosis not present

## 2017-10-27 DIAGNOSIS — J9809 Other diseases of bronchus, not elsewhere classified: Secondary | ICD-10-CM | POA: Diagnosis not present

## 2017-10-27 DIAGNOSIS — J189 Pneumonia, unspecified organism: Secondary | ICD-10-CM | POA: Diagnosis not present

## 2017-10-27 DIAGNOSIS — E119 Type 2 diabetes mellitus without complications: Secondary | ICD-10-CM | POA: Diagnosis not present

## 2017-10-27 DIAGNOSIS — B965 Pseudomonas (aeruginosa) (mallei) (pseudomallei) as the cause of diseases classified elsewhere: Secondary | ICD-10-CM | POA: Diagnosis not present

## 2017-10-27 DIAGNOSIS — Z9911 Dependence on respirator [ventilator] status: Secondary | ICD-10-CM | POA: Diagnosis not present

## 2017-10-27 DIAGNOSIS — I10 Essential (primary) hypertension: Secondary | ICD-10-CM | POA: Diagnosis not present

## 2017-10-28 DIAGNOSIS — J9809 Other diseases of bronchus, not elsewhere classified: Secondary | ICD-10-CM | POA: Diagnosis not present

## 2017-10-28 DIAGNOSIS — F17210 Nicotine dependence, cigarettes, uncomplicated: Secondary | ICD-10-CM | POA: Diagnosis not present

## 2017-10-28 DIAGNOSIS — Z794 Long term (current) use of insulin: Secondary | ICD-10-CM | POA: Diagnosis not present

## 2017-10-28 DIAGNOSIS — F411 Generalized anxiety disorder: Secondary | ICD-10-CM | POA: Diagnosis not present

## 2017-10-28 DIAGNOSIS — R6521 Severe sepsis with septic shock: Secondary | ICD-10-CM | POA: Diagnosis not present

## 2017-10-28 DIAGNOSIS — J151 Pneumonia due to Pseudomonas: Secondary | ICD-10-CM | POA: Diagnosis not present

## 2017-10-28 DIAGNOSIS — G809 Cerebral palsy, unspecified: Secondary | ICD-10-CM | POA: Diagnosis not present

## 2017-10-28 DIAGNOSIS — E119 Type 2 diabetes mellitus without complications: Secondary | ICD-10-CM | POA: Diagnosis not present

## 2017-10-28 DIAGNOSIS — A419 Sepsis, unspecified organism: Secondary | ICD-10-CM | POA: Diagnosis not present

## 2017-10-28 DIAGNOSIS — J449 Chronic obstructive pulmonary disease, unspecified: Secondary | ICD-10-CM | POA: Diagnosis not present

## 2017-10-28 DIAGNOSIS — J9601 Acute respiratory failure with hypoxia: Secondary | ICD-10-CM | POA: Diagnosis not present

## 2017-10-28 DIAGNOSIS — Z9911 Dependence on respirator [ventilator] status: Secondary | ICD-10-CM | POA: Diagnosis not present

## 2017-10-29 DIAGNOSIS — J9809 Other diseases of bronchus, not elsewhere classified: Secondary | ICD-10-CM | POA: Diagnosis not present

## 2017-10-29 DIAGNOSIS — J151 Pneumonia due to Pseudomonas: Secondary | ICD-10-CM | POA: Diagnosis not present

## 2017-10-29 DIAGNOSIS — E876 Hypokalemia: Secondary | ICD-10-CM | POA: Diagnosis not present

## 2017-10-29 DIAGNOSIS — J449 Chronic obstructive pulmonary disease, unspecified: Secondary | ICD-10-CM | POA: Diagnosis not present

## 2017-10-29 DIAGNOSIS — A419 Sepsis, unspecified organism: Secondary | ICD-10-CM | POA: Diagnosis not present

## 2017-10-29 DIAGNOSIS — F17210 Nicotine dependence, cigarettes, uncomplicated: Secondary | ICD-10-CM | POA: Diagnosis not present

## 2017-10-29 DIAGNOSIS — Z9911 Dependence on respirator [ventilator] status: Secondary | ICD-10-CM | POA: Diagnosis not present

## 2017-10-29 DIAGNOSIS — G809 Cerebral palsy, unspecified: Secondary | ICD-10-CM | POA: Diagnosis not present

## 2017-10-29 DIAGNOSIS — R6521 Severe sepsis with septic shock: Secondary | ICD-10-CM | POA: Diagnosis not present

## 2017-10-29 DIAGNOSIS — F411 Generalized anxiety disorder: Secondary | ICD-10-CM | POA: Diagnosis not present

## 2017-10-29 DIAGNOSIS — I10 Essential (primary) hypertension: Secondary | ICD-10-CM | POA: Diagnosis not present

## 2017-10-29 DIAGNOSIS — J9600 Acute respiratory failure, unspecified whether with hypoxia or hypercapnia: Secondary | ICD-10-CM | POA: Diagnosis not present

## 2017-10-30 DIAGNOSIS — F411 Generalized anxiety disorder: Secondary | ICD-10-CM | POA: Diagnosis not present

## 2017-10-30 DIAGNOSIS — Z794 Long term (current) use of insulin: Secondary | ICD-10-CM | POA: Diagnosis not present

## 2017-10-30 DIAGNOSIS — J9602 Acute respiratory failure with hypercapnia: Secondary | ICD-10-CM | POA: Diagnosis not present

## 2017-10-30 DIAGNOSIS — I10 Essential (primary) hypertension: Secondary | ICD-10-CM | POA: Diagnosis not present

## 2017-10-30 DIAGNOSIS — R1310 Dysphagia, unspecified: Secondary | ICD-10-CM | POA: Diagnosis not present

## 2017-10-30 DIAGNOSIS — F17210 Nicotine dependence, cigarettes, uncomplicated: Secondary | ICD-10-CM | POA: Diagnosis not present

## 2017-10-30 DIAGNOSIS — E119 Type 2 diabetes mellitus without complications: Secondary | ICD-10-CM | POA: Diagnosis not present

## 2017-10-30 DIAGNOSIS — J9601 Acute respiratory failure with hypoxia: Secondary | ICD-10-CM | POA: Diagnosis not present

## 2017-10-30 DIAGNOSIS — J9809 Other diseases of bronchus, not elsewhere classified: Secondary | ICD-10-CM | POA: Diagnosis not present

## 2017-10-30 DIAGNOSIS — G809 Cerebral palsy, unspecified: Secondary | ICD-10-CM | POA: Diagnosis not present

## 2017-10-30 DIAGNOSIS — Z9911 Dependence on respirator [ventilator] status: Secondary | ICD-10-CM | POA: Diagnosis not present

## 2017-10-30 DIAGNOSIS — J151 Pneumonia due to Pseudomonas: Secondary | ICD-10-CM | POA: Diagnosis not present

## 2017-10-30 DIAGNOSIS — J449 Chronic obstructive pulmonary disease, unspecified: Secondary | ICD-10-CM | POA: Diagnosis not present

## 2017-10-31 DIAGNOSIS — J9600 Acute respiratory failure, unspecified whether with hypoxia or hypercapnia: Secondary | ICD-10-CM | POA: Diagnosis not present

## 2017-10-31 DIAGNOSIS — J9611 Chronic respiratory failure with hypoxia: Secondary | ICD-10-CM | POA: Diagnosis not present

## 2017-10-31 DIAGNOSIS — G809 Cerebral palsy, unspecified: Secondary | ICD-10-CM | POA: Diagnosis not present

## 2017-10-31 DIAGNOSIS — J155 Pneumonia due to Escherichia coli: Secondary | ICD-10-CM | POA: Diagnosis not present

## 2017-10-31 DIAGNOSIS — Z993 Dependence on wheelchair: Secondary | ICD-10-CM | POA: Diagnosis not present

## 2017-10-31 DIAGNOSIS — J151 Pneumonia due to Pseudomonas: Secondary | ICD-10-CM | POA: Diagnosis not present

## 2017-10-31 DIAGNOSIS — J449 Chronic obstructive pulmonary disease, unspecified: Secondary | ICD-10-CM | POA: Diagnosis not present

## 2017-10-31 DIAGNOSIS — Z9911 Dependence on respirator [ventilator] status: Secondary | ICD-10-CM | POA: Diagnosis not present

## 2017-10-31 DIAGNOSIS — J9601 Acute respiratory failure with hypoxia: Secondary | ICD-10-CM | POA: Diagnosis not present

## 2017-11-01 DIAGNOSIS — Z993 Dependence on wheelchair: Secondary | ICD-10-CM | POA: Diagnosis not present

## 2017-11-01 DIAGNOSIS — G809 Cerebral palsy, unspecified: Secondary | ICD-10-CM | POA: Diagnosis not present

## 2017-11-01 DIAGNOSIS — J9611 Chronic respiratory failure with hypoxia: Secondary | ICD-10-CM | POA: Diagnosis not present

## 2017-11-01 DIAGNOSIS — J155 Pneumonia due to Escherichia coli: Secondary | ICD-10-CM | POA: Diagnosis not present

## 2017-11-01 DIAGNOSIS — J9601 Acute respiratory failure with hypoxia: Secondary | ICD-10-CM | POA: Diagnosis not present

## 2017-11-01 DIAGNOSIS — J9600 Acute respiratory failure, unspecified whether with hypoxia or hypercapnia: Secondary | ICD-10-CM | POA: Diagnosis not present

## 2017-11-01 DIAGNOSIS — Z9911 Dependence on respirator [ventilator] status: Secondary | ICD-10-CM | POA: Diagnosis not present

## 2017-11-01 DIAGNOSIS — J449 Chronic obstructive pulmonary disease, unspecified: Secondary | ICD-10-CM | POA: Diagnosis not present

## 2017-11-01 DIAGNOSIS — J151 Pneumonia due to Pseudomonas: Secondary | ICD-10-CM | POA: Diagnosis not present

## 2017-11-02 DIAGNOSIS — Z9911 Dependence on respirator [ventilator] status: Secondary | ICD-10-CM | POA: Diagnosis not present

## 2017-11-02 DIAGNOSIS — J9601 Acute respiratory failure with hypoxia: Secondary | ICD-10-CM | POA: Diagnosis not present

## 2017-11-02 DIAGNOSIS — J9611 Chronic respiratory failure with hypoxia: Secondary | ICD-10-CM | POA: Diagnosis not present

## 2017-11-02 DIAGNOSIS — Z993 Dependence on wheelchair: Secondary | ICD-10-CM | POA: Diagnosis not present

## 2017-11-02 DIAGNOSIS — J155 Pneumonia due to Escherichia coli: Secondary | ICD-10-CM | POA: Diagnosis not present

## 2017-11-02 DIAGNOSIS — J151 Pneumonia due to Pseudomonas: Secondary | ICD-10-CM | POA: Diagnosis not present

## 2017-11-02 DIAGNOSIS — G809 Cerebral palsy, unspecified: Secondary | ICD-10-CM | POA: Diagnosis not present

## 2017-11-02 DIAGNOSIS — J449 Chronic obstructive pulmonary disease, unspecified: Secondary | ICD-10-CM | POA: Diagnosis not present

## 2017-11-02 DIAGNOSIS — J9600 Acute respiratory failure, unspecified whether with hypoxia or hypercapnia: Secondary | ICD-10-CM | POA: Diagnosis not present

## 2017-11-03 DIAGNOSIS — Z993 Dependence on wheelchair: Secondary | ICD-10-CM | POA: Diagnosis not present

## 2017-11-03 DIAGNOSIS — Z9911 Dependence on respirator [ventilator] status: Secondary | ICD-10-CM | POA: Diagnosis not present

## 2017-11-03 DIAGNOSIS — G809 Cerebral palsy, unspecified: Secondary | ICD-10-CM | POA: Diagnosis not present

## 2017-11-03 DIAGNOSIS — J9601 Acute respiratory failure with hypoxia: Secondary | ICD-10-CM | POA: Diagnosis not present

## 2017-11-03 DIAGNOSIS — J151 Pneumonia due to Pseudomonas: Secondary | ICD-10-CM | POA: Diagnosis not present

## 2017-11-03 DIAGNOSIS — J449 Chronic obstructive pulmonary disease, unspecified: Secondary | ICD-10-CM | POA: Diagnosis not present

## 2017-11-13 ENCOUNTER — Inpatient Hospital Stay
Admission: RE | Admit: 2017-11-13 | Discharge: 2017-12-06 | Disposition: A | Discharge status: Skilled Nursing Facility | Payer: Medicaid Other | Source: Other Acute Inpatient Hospital | Attending: General Practice | Admitting: General Practice

## 2017-11-13 ENCOUNTER — Other Ambulatory Visit (HOSPITAL_COMMUNITY): Payer: Medicaid Other

## 2017-11-13 DIAGNOSIS — J9621 Acute and chronic respiratory failure with hypoxia: Secondary | ICD-10-CM

## 2017-11-13 DIAGNOSIS — K8020 Calculus of gallbladder without cholecystitis without obstruction: Secondary | ICD-10-CM

## 2017-11-13 DIAGNOSIS — J151 Pneumonia due to Pseudomonas: Secondary | ICD-10-CM

## 2017-11-13 DIAGNOSIS — R1310 Dysphagia, unspecified: Secondary | ICD-10-CM

## 2017-11-13 DIAGNOSIS — J9690 Respiratory failure, unspecified, unspecified whether with hypoxia or hypercapnia: Secondary | ICD-10-CM

## 2017-11-13 DIAGNOSIS — R6521 Severe sepsis with septic shock: Secondary | ICD-10-CM

## 2017-11-13 DIAGNOSIS — A419 Sepsis, unspecified organism: Secondary | ICD-10-CM

## 2017-11-13 DIAGNOSIS — Z931 Gastrostomy status: Secondary | ICD-10-CM

## 2017-11-13 DIAGNOSIS — G40909 Epilepsy, unspecified, not intractable, without status epilepticus: Secondary | ICD-10-CM

## 2017-11-13 DIAGNOSIS — G809 Cerebral palsy, unspecified: Secondary | ICD-10-CM

## 2017-11-13 DIAGNOSIS — I509 Heart failure, unspecified: Secondary | ICD-10-CM

## 2017-11-13 MED ORDER — IOPAMIDOL (ISOVUE-300) INJECTION 61%
INTRAVENOUS | Status: AC
  Filled 2017-11-13: qty 50

## 2017-11-14 ENCOUNTER — Institutional Professional Consult (permissible substitution) (HOSPITAL_COMMUNITY): Payer: Medicaid Other

## 2017-11-14 LAB — COMPREHENSIVE METABOLIC PANEL
ALK PHOS: 98 U/L (ref 38–126)
ALT: 33 U/L (ref 0–44)
AST: 24 U/L (ref 15–41)
Albumin: 3.4 g/dL — ABNORMAL LOW (ref 3.5–5.0)
Anion gap: 9 (ref 5–15)
BILIRUBIN TOTAL: 1.5 mg/dL — AB (ref 0.3–1.2)
BUN: 19 mg/dL (ref 8–23)
CO2: 33 mmol/L — AB (ref 22–32)
Calcium: 9.5 mg/dL (ref 8.9–10.3)
Chloride: 93 mmol/L — ABNORMAL LOW (ref 98–111)
Creatinine, Ser: 0.47 mg/dL — ABNORMAL LOW (ref 0.61–1.24)
Glucose, Bld: 158 mg/dL — ABNORMAL HIGH (ref 70–99)
POTASSIUM: 3.8 mmol/L (ref 3.5–5.1)
Sodium: 135 mmol/L (ref 135–145)
Total Protein: 8.2 g/dL — ABNORMAL HIGH (ref 6.5–8.1)

## 2017-11-14 LAB — CBC WITH DIFFERENTIAL/PLATELET
ABS IMMATURE GRANULOCYTES: 0.04 10*3/uL (ref 0.00–0.07)
BASOS ABS: 0.1 10*3/uL (ref 0.0–0.1)
BASOS PCT: 1 %
EOS ABS: 0.4 10*3/uL (ref 0.0–0.5)
Eosinophils Relative: 5 %
HEMATOCRIT: 56.9 % — AB (ref 39.0–52.0)
Hemoglobin: 17.3 g/dL — ABNORMAL HIGH (ref 13.0–17.0)
IMMATURE GRANULOCYTES: 1 %
LYMPHS ABS: 2.1 10*3/uL (ref 0.7–4.0)
Lymphocytes Relative: 26 %
MCH: 26.9 pg (ref 26.0–34.0)
MCHC: 30.4 g/dL (ref 30.0–36.0)
MCV: 88.4 fL (ref 80.0–100.0)
Monocytes Absolute: 0.8 10*3/uL (ref 0.1–1.0)
Monocytes Relative: 10 %
NEUTROS ABS: 4.6 10*3/uL (ref 1.7–7.7)
NEUTROS PCT: 57 %
NRBC: 0 % (ref 0.0–0.2)
PLATELETS: 225 10*3/uL (ref 150–400)
RBC: 6.44 MIL/uL — ABNORMAL HIGH (ref 4.22–5.81)
RDW: 14.6 % (ref 11.5–15.5)
WBC: 8.1 10*3/uL (ref 4.0–10.5)

## 2017-11-14 LAB — PROTIME-INR
INR: 1.18
Prothrombin Time: 14.9 seconds (ref 11.4–15.2)

## 2017-11-14 LAB — PHOSPHORUS: Phosphorus: 4 mg/dL (ref 2.5–4.6)

## 2017-11-14 LAB — MAGNESIUM: MAGNESIUM: 2.1 mg/dL (ref 1.7–2.4)

## 2017-11-14 LAB — HEMOGLOBIN A1C
Hgb A1c MFr Bld: 8.1 % — ABNORMAL HIGH (ref 4.8–5.6)
Mean Plasma Glucose: 185.77 mg/dL

## 2017-11-15 DIAGNOSIS — J9621 Acute and chronic respiratory failure with hypoxia: Secondary | ICD-10-CM | POA: Diagnosis not present

## 2017-11-15 DIAGNOSIS — J151 Pneumonia due to Pseudomonas: Secondary | ICD-10-CM | POA: Diagnosis not present

## 2017-11-15 DIAGNOSIS — R1310 Dysphagia, unspecified: Secondary | ICD-10-CM | POA: Diagnosis not present

## 2017-11-15 DIAGNOSIS — G809 Cerebral palsy, unspecified: Secondary | ICD-10-CM | POA: Diagnosis not present

## 2017-11-15 NOTE — Consult Note (Signed)
Pulmonary Critical Care Medicine [facility] [place]  PULMONARY SERVICE  Date of Service: 11/15/2017  PULMONARY CRITICAL CARE CONSULT   Sergio Sergio Murphy  ZOX:[id number]  DOB: Apr 26, 1955   DOA: 11/13/2017  Referring Physician: Carron Curie, MD  HPI: ALTER Sergio Murphy is a 62 y.o. male seen for follow up of Acute on Chronic Respiratory Failure.  Patient is referred to our facility for further management and weaning.  Patient has a medical history significant for hypertension and COPD seizure disorder dysphagia who has a chronic tracheostomy and chronic respiratory failure.  Patient also has a history of cerebral palsy with a seizure disorder.  Patient was brought to the hospital because of increasing confusion from his baseline.  Patient is apparently a resident of a nursing home.  The patient at the time of evaluation was found to be tachycardic and hypoxic not much able to provide a history and even now he is awake but not much able to provide a history.  On initial admission his pH was 7.25 PCO2 82 PO2 68 and he was intubated at that time placed on the ventilator.  Patient had evaluation had chest x-ray done which was suspicious for pneumonia but also they felt they could be atelectasis.  Because of his respiratory failure he eventually end up having to have a tracheostomy done and review of the notes from the discharge summary reveal that the patient will be proceeded to decannulation eventually.  Patient also had eventual diagnosis of Pseudomonas pneumonia and was treated with cefepime.  He was further found to have dysphagia secondary to respiratory failure.  Review of Systems:  ROS performed and is unremarkable other than noted above.  Past Medical History:  Diagnosis Date  . Anxiety  . Cerebral palsy (HCC)  . Cervical disc disorder  . Chronic neck pain  . COPD (chronic obstructive pulmonary disease) (HCC)  Nebulizer qid and prn. No other meds. No admits for COPD  . Depression  .  GERD (gastroesophageal reflux disease)  . History of eye disorder  cataract  . Hypertension  On no meds currently  . Insomnia  . Muscle weakness  . Osteoarthritis  . Resides in skilled nursing facility  . Urinary incontinence  . Wheelchair bound   Past Surgical History:  Procedure Laterality Date  . Derry Skill REPAIR X3  1966/1968/1969  . ANTERIOR CERVICAL DISCECTOMY 10/16  . CATARACT EXTRACTION Right 09/14/2015  . HERNIA REPAIR   Allergies  Allergen Reactions  . Latex Swelling  . Benazepril Other (See Comments)  AKI and hyperkalemia  . Tegretol [Carbamazepine] Other (See Comments)  Reaction not listed on MAR   Social History   Socioeconomic History  . Marital status: Single  Spouse name: Not on file  . Number of children: Not on file  . Years of education: Not on file  . Highest education level: Not on file  Occupational History  . Not on file  Social Needs  . Financial resource strain: Not on file  . Food insecurity:  Worry: Not on file  Inability: Not on file  . Transportation needs:  Medical: Not on file  Non-medical: Not on file  Tobacco Use  . Smoking status: Former Smoker  Packs/day: 1.00  Years: 30.00  Pack years: 30.00  Types: Cigarettes  Last attempt to quit: 11/24/2015  Years since quitting: 1.8  . Smokeless tobacco: Never Used  Substance and Sexual Activity  . Alcohol use: Yes  Comment: 2 beers per night to help sleep if needed  .  Drug use: No     Medications: Reviewed on Rounds  Physical Exam:  Vitals: Temperature 98.4 pulse 90 respiratory rate 15 blood pressure 144/78 saturations 94%  Ventilator Settings currently off the ventilator on T collar FiO2 28% with PMV  . General: Comfortable at this time . Eyes: Grossly normal lids, irises & conjunctiva . ENT: grossly tongue is normal . Neck: no obvious mass . Cardiovascular: S1-S2 normal no gallop . Respiratory: Coarse breath sounds few rhonchi . Abdomen: Soft  nontender . Skin: no rash seen on limited exam . Musculoskeletal: not rigid . Psychiatric:unable to assess . Neurologic: no seizure no involuntary movements         Labs on Admission:  Basic Metabolic Panel: Recent Labs  Lab 11/14/17 0551  NA 135  K 3.8  CL 93*  CO2 33*  GLUCOSE 158*  BUN 19  CREATININE 0.47*  CALCIUM 9.5  MG 2.1  PHOS 4.0    No results for input(s): PHART, PCO2ART, PO2ART, HCO3, O2SAT in the last 168 hours.  Liver Function Tests: Recent Labs  Lab 11/14/17 0551  AST 24  ALT 33  ALKPHOS 98  BILITOT 1.5*  PROT 8.2*  ALBUMIN 3.4*   No results for input(s): LIPASE, AMYLASE in the last 168 hours. No results for input(s): AMMONIA in the last 168 hours.  CBC: Recent Labs  Lab 11/14/17 0551  WBC 8.1  NEUTROABS 4.6  HGB 17.3*  HCT 56.9*  MCV 88.4  PLT 225    Cardiac Enzymes: No results for input(s): CKTOTAL, CKMB, CKMBINDEX, TROPONINI in the last 168 hours.  BNP (last 3 results) No results for input(s): BNP in the last 8760 hours.  ProBNP (last 3 results) No results for input(s): PROBNP in the last 8760 hours.   Radiological Exams on Admission: Dg Abdomen Peg Tube Location  Result Date: 11/13/2017 CLINICAL DATA:  Gastrostomy tube position check EXAM: ABDOMEN - 1 VIEW COMPARISON:  None. FINDINGS: Contrast material has been instilled through the percutaneous gastrostomy tube. There is contrast seen outlining rugal folds within the stomach and extending into the proximal duodenum. IMPRESSION: Gastrostomy tube tip in the stomach. Electronically Signed   By: Deatra Robinson M.D.   On: 11/13/2017 23:18   Dg Chest Port 1 View  Result Date: 11/14/2017 CLINICAL DATA:  History of pneumonia, respiratory failure, follow-up EXAM: PORTABLE CHEST 1 VIEW COMPARISON:  None. FINDINGS: There is elevation of the left hemidiaphragm with resultant linear atelectasis of the left lung base. No definite pneumonia is seen. Mild volume loss at the right lung base is  present and a small nodule at the right lung base cannot be excluded. This could represent nipple shadow but repeat deeper inspiration film with nipple markers is recommended. Right PICC line tip is seen to the mid SVC and no pneumothorax is evident. Tracheostomy is noted. Cardiomegaly is stable. IMPRESSION: 1. Elevation of the left hemidiaphragm with resultant left basilar atelectasis. 2. Nodule overlying right lung base may represent nipple but deeper inspiration PA chest x-ray with nipple markers is recommended. 3. Right PICC line tip in the mid SVC. Electronically Signed   By: Dwyane Dee M.D.   On: 11/14/2017 10:47    Assessment/Plan Active Problems:   Acute on chronic respiratory failure with hypoxia (HCC)   Dysphagia   Severe sepsis with septic shock (HCC)   Pneumonia due to Pseudomonas (HCC)   Cerebral palsy (HCC)   Seizure disorder (HCC)   1. Acute on chronic respiratory failure with hypoxia patient has  chronic tracheostomy reportedly.  We will continue with full supportive care on T collar patient is tolerating PMV we will try to advance time on PMV as tolerated.  Patient needs continue with aggressive pulmonary toilet supportive care. 2. Dysphagia this was felt to be secondary to the tracheostomy and I think we need to probably have this reevaluated.  Patient also underwent a PEG tube placement for the same reason. 3. Sepsis with shock patient was treated with cefepime and apparently completed 14 days it was felt to be related to Pseudomonas pneumonia. 4. Pneumonia due to Pseudomonas as above was treated with antibiotics we will continue to monitor. 5. Cerebral palsy with seizures right now is seizure-free and is actually awake and interacting.  I have personally seen and evaluated the patient, evaluated laboratory and imaging results, formulated the assessment and plan and placed orders. The Patient requires high complexity decision making for assessment and support.  Case was  discussed on Rounds with the Respiratory Therapy Staff Time Spent  Yevonne Pax, MD [facility] Pulmonary Critical Care Medicine Sleep Medicine

## 2017-11-17 DIAGNOSIS — J9621 Acute and chronic respiratory failure with hypoxia: Secondary | ICD-10-CM | POA: Diagnosis not present

## 2017-11-17 DIAGNOSIS — G40909 Epilepsy, unspecified, not intractable, without status epilepticus: Secondary | ICD-10-CM

## 2017-11-17 DIAGNOSIS — R1310 Dysphagia, unspecified: Secondary | ICD-10-CM | POA: Diagnosis not present

## 2017-11-17 DIAGNOSIS — A419 Sepsis, unspecified organism: Secondary | ICD-10-CM

## 2017-11-17 DIAGNOSIS — G809 Cerebral palsy, unspecified: Secondary | ICD-10-CM | POA: Diagnosis not present

## 2017-11-17 DIAGNOSIS — J151 Pneumonia due to Pseudomonas: Secondary | ICD-10-CM | POA: Diagnosis not present

## 2017-11-17 DIAGNOSIS — R6521 Severe sepsis with septic shock: Secondary | ICD-10-CM

## 2017-11-17 MED ORDER — BISACODYL 10 MG RE SUPP
10.00 | RECTAL | Status: DC
Start: ? — End: 2017-11-17

## 2017-11-17 MED ORDER — DOCUSATE SODIUM 150 MG/15ML PO LIQD
100.00 | ORAL | Status: DC
Start: 2017-11-13 — End: 2017-11-17

## 2017-11-17 MED ORDER — GENERIC EXTERNAL MEDICATION
16.00 | Status: DC
Start: ? — End: 2017-11-17

## 2017-11-17 MED ORDER — OXYCODONE HCL 5 MG PO TABS
5.00 | ORAL_TABLET | ORAL | Status: DC
Start: ? — End: 2017-11-17

## 2017-11-17 MED ORDER — GENERIC EXTERNAL MEDICATION
10.00 | Status: DC
Start: ? — End: 2017-11-17

## 2017-11-17 MED ORDER — SERTRALINE HCL 50 MG PO TABS
100.00 | ORAL_TABLET | ORAL | Status: DC
Start: 2017-11-13 — End: 2017-11-17

## 2017-11-17 MED ORDER — SODIUM CHLORIDE 3 % IN NEBU
3.00 | INHALATION_SOLUTION | RESPIRATORY_TRACT | Status: DC
Start: 2017-11-13 — End: 2017-11-17

## 2017-11-17 MED ORDER — LACTULOSE 10 GM/15ML PO SOLN
20.00 | ORAL | Status: DC
Start: ? — End: 2017-11-17

## 2017-11-17 MED ORDER — SENNA 8.8 MG/5ML PO SYRP
5.00 | ORAL_SOLUTION | ORAL | Status: DC
Start: 2017-11-13 — End: 2017-11-17

## 2017-11-17 MED ORDER — MAGNESIUM SULFATE 2 GM/50ML IV SOLN
2.00 | INTRAVENOUS | Status: DC
Start: ? — End: 2017-11-17

## 2017-11-17 MED ORDER — CHLORHEXIDINE GLUCONATE 0.12 % MT SOLN
15.00 | OROMUCOSAL | Status: DC
Start: 2017-11-13 — End: 2017-11-17

## 2017-11-17 MED ORDER — GENERIC EXTERNAL MEDICATION
40.00 | Status: DC
Start: ? — End: 2017-11-17

## 2017-11-17 MED ORDER — ENOXAPARIN SODIUM 40 MG/0.4ML ~~LOC~~ SOLN
40.00 | SUBCUTANEOUS | Status: DC
Start: 2017-11-14 — End: 2017-11-17

## 2017-11-17 MED ORDER — OXYCODONE HCL 5 MG PO TABS
10.00 | ORAL_TABLET | ORAL | Status: DC
Start: 2017-11-13 — End: 2017-11-17

## 2017-11-17 MED ORDER — BACLOFEN 10 MG PO TABS
10.00 | ORAL_TABLET | ORAL | Status: DC
Start: 2017-11-13 — End: 2017-11-17

## 2017-11-17 MED ORDER — MAGNESIUM HYDROXIDE 2400 MG/10ML PO SUSP
10.00 | ORAL | Status: DC
Start: ? — End: 2017-11-17

## 2017-11-17 MED ORDER — TRAZODONE HCL 50 MG PO TABS
100.00 | ORAL_TABLET | ORAL | Status: DC
Start: 2017-11-13 — End: 2017-11-17

## 2017-11-17 MED ORDER — SENNOSIDES-DOCUSATE SODIUM 8.6-50 MG PO TABS
1.00 | ORAL_TABLET | ORAL | Status: DC
Start: ? — End: 2017-11-17

## 2017-11-17 MED ORDER — NICOTINE 21 MG/24HR TD PT24
1.00 | MEDICATED_PATCH | TRANSDERMAL | Status: DC
Start: 2017-11-14 — End: 2017-11-17

## 2017-11-17 MED ORDER — DAKINS (1/2 STRENGTH) 0.25 % EX SOLN
CUTANEOUS | Status: DC
Start: 2017-11-13 — End: 2017-11-17

## 2017-11-17 MED ORDER — FENTANYL CITRATE (PF) 50 MCG/ML IJ SOLN
25.00 | INTRAMUSCULAR | Status: DC
Start: ? — End: 2017-11-17

## 2017-11-17 MED ORDER — POLYETHYLENE GLYCOL 3350 17 G PO PACK
17.00 | PACK | ORAL | Status: DC
Start: ? — End: 2017-11-17

## 2017-11-17 MED ORDER — CLONIDINE HCL 0.2 MG PO TABS
.20 | ORAL_TABLET | ORAL | Status: DC
Start: 2017-11-13 — End: 2017-11-17

## 2017-11-17 MED ORDER — IPRATROPIUM-ALBUTEROL 0.5-2.5 (3) MG/3ML IN SOLN
3.00 | RESPIRATORY_TRACT | Status: DC
Start: 2017-11-13 — End: 2017-11-17

## 2017-11-17 MED ORDER — GENERIC EXTERNAL MEDICATION
1.00 | Status: DC
Start: ? — End: 2017-11-17

## 2017-11-17 MED ORDER — DIAZEPAM 5 MG PO TABS
5.00 | ORAL_TABLET | ORAL | Status: DC
Start: 2017-11-14 — End: 2017-11-17

## 2017-11-17 MED ORDER — LEVETIRACETAM 100 MG/ML PO SOLN
500.00 | ORAL | Status: DC
Start: 2017-11-13 — End: 2017-11-17

## 2017-11-17 MED ORDER — ASPIRIN 81 MG PO CHEW
81.00 | CHEWABLE_TABLET | ORAL | Status: DC
Start: 2017-11-14 — End: 2017-11-17

## 2017-11-17 MED ORDER — INSULIN GLARGINE 100 UNIT/ML SOLOSTAR PEN
30.00 | PEN_INJECTOR | SUBCUTANEOUS | Status: DC
Start: 2017-11-14 — End: 2017-11-17

## 2017-11-17 MED ORDER — INSULIN REGULAR HUMAN 100 UNIT/ML IJ SOLN
.00 | INTRAMUSCULAR | Status: DC
Start: 2017-11-13 — End: 2017-11-17

## 2017-11-17 MED ORDER — FUROSEMIDE 20 MG PO TABS
20.00 | ORAL_TABLET | ORAL | Status: DC
Start: 2017-11-14 — End: 2017-11-17

## 2017-11-17 MED ORDER — MICONAZOLE NITRATE 2 % EX CREA
TOPICAL_CREAM | CUTANEOUS | Status: DC
Start: 2017-11-13 — End: 2017-11-17

## 2017-11-17 MED ORDER — DEXTROSE 50 % IV SOLN
12.50 | INTRAVENOUS | Status: DC
Start: ? — End: 2017-11-17

## 2017-11-17 MED ORDER — AMLODIPINE BESYLATE 5 MG PO TABS
5.00 | ORAL_TABLET | ORAL | Status: DC
Start: 2017-11-14 — End: 2017-11-17

## 2017-11-17 NOTE — Progress Notes (Signed)
Pulmonary Critical Care Medicine [facility] [place]   PULMONARY CRITICAL CARE SERVICE  PROGRESS NOTE  Date of Service: 11/17/2017  Sergio Murphy  ZOX:[id number]  DOB: March 09, 1955   DOA: 11/13/2017  Referring Physician: Carron Curie, MD  HPI: Sergio Murphy is a 62 y.o. male seen for follow up of Acute on Chronic Respiratory Failure.  He is doing fairly well on T collar has been on 35% oxygen.  The patient has been apparently tolerating PMV.  He is a 6 XLT in place  Medications: Reviewed on Rounds  Physical Exam:  Vitals: Temperature 98.5 pulse 87 respiratory rate 28 blood pressure 149/86 saturations are 98%  Ventilator Settings off the ventilator on T collar right now  . General: Comfortable at this time . Eyes: Grossly normal lids, irises & conjunctiva . ENT: grossly tongue is normal . Neck: no obvious mass . Cardiovascular: S1 S2 normal no gallop . Respiratory: No rhonchi no rales are noted at this time . Abdomen: soft . Skin: no rash seen on limited exam . Musculoskeletal: not rigid . Psychiatric:unable to assess . Neurologic: no seizure no involuntary movements         Lab Data:   Basic Metabolic Panel: Recent Labs  Lab 11/14/17 0551  NA 135  K 3.8  CL 93*  CO2 33*  GLUCOSE 158*  BUN 19  CREATININE 0.47*  CALCIUM 9.5  MG 2.1  PHOS 4.0    ABG: No results for input(s): PHART, PCO2ART, PO2ART, HCO3, O2SAT in the last 168 hours.  Liver Function Tests: Recent Labs  Lab 11/14/17 0551  AST 24  ALT 33  ALKPHOS 98  BILITOT 1.5*  PROT 8.2*  ALBUMIN 3.4*   No results for input(s): LIPASE, AMYLASE in the last 168 hours. No results for input(s): AMMONIA in the last 168 hours.  CBC: Recent Labs  Lab 11/14/17 0551  WBC 8.1  NEUTROABS 4.6  HGB 17.3*  HCT 56.9*  MCV 88.4  PLT 225    Cardiac Enzymes: No results for input(s): CKTOTAL, CKMB, CKMBINDEX, TROPONINI in the last 168 hours.  BNP (last 3 results) No results for input(s): BNP  in the last 8760 hours.  ProBNP (last 3 results) No results for input(s): PROBNP in the last 8760 hours.  Radiological Exams: No results found.  Assessment/Plan Active Problems:   Acute on chronic respiratory failure with hypoxia (HCC)   Dysphagia   Severe sepsis with septic shock (HCC)   Pneumonia due to Pseudomonas (HCC)   Cerebral palsy (HCC)   Seizure disorder (HCC)   1. Acute on chronic respiratory failure with hypoxia we will continue with the T collar and I want to try to see about the beginning capping and potentially weaning him off his tracheostomy.  I was not able to see anywhere in the chart where the trach was supposed to remain in place long-term. 2. Severe sepsis with shock right now is hemodynamically stable we will continue with present therapy. 3. Pneumonia due to Pseudomonas treated follow-up x-rays along 4. Cerebral palsy stable at baseline 5. Seizure disorder no active seizures 6. Dysphagia follow-up with speech therapy prognosis guarded   I have personally seen and evaluated the patient, evaluated laboratory and imaging results, formulated the assessment and plan and placed orders. The Patient requires high complexity decision making for assessment and support.  Case was discussed on Rounds with the Respiratory Therapy Staff  Yevonne Pax, MD [facility] Pulmonary Critical Care Medicine Sleep Medicine

## 2017-11-18 DIAGNOSIS — J151 Pneumonia due to Pseudomonas: Secondary | ICD-10-CM | POA: Diagnosis not present

## 2017-11-18 DIAGNOSIS — G809 Cerebral palsy, unspecified: Secondary | ICD-10-CM | POA: Diagnosis not present

## 2017-11-18 DIAGNOSIS — J9621 Acute and chronic respiratory failure with hypoxia: Secondary | ICD-10-CM | POA: Diagnosis not present

## 2017-11-18 DIAGNOSIS — R1310 Dysphagia, unspecified: Secondary | ICD-10-CM | POA: Diagnosis not present

## 2017-11-18 LAB — CBC
HEMATOCRIT: 56.5 % — AB (ref 39.0–52.0)
Hemoglobin: 17.2 g/dL — ABNORMAL HIGH (ref 13.0–17.0)
MCH: 26.9 pg (ref 26.0–34.0)
MCHC: 30.4 g/dL (ref 30.0–36.0)
MCV: 88.3 fL (ref 80.0–100.0)
NRBC: 0 % (ref 0.0–0.2)
PLATELETS: 234 10*3/uL (ref 150–400)
RBC: 6.4 MIL/uL — ABNORMAL HIGH (ref 4.22–5.81)
RDW: 14.9 % (ref 11.5–15.5)
WBC: 9.6 10*3/uL (ref 4.0–10.5)

## 2017-11-18 LAB — BASIC METABOLIC PANEL
ANION GAP: 11 (ref 5–15)
BUN: 23 mg/dL (ref 8–23)
CO2: 32 mmol/L (ref 22–32)
Calcium: 9.4 mg/dL (ref 8.9–10.3)
Chloride: 94 mmol/L — ABNORMAL LOW (ref 98–111)
Creatinine, Ser: 0.52 mg/dL — ABNORMAL LOW (ref 0.61–1.24)
GFR calc Af Amer: >60 mL/min (ref >60)
GLUCOSE: 170 mg/dL — AB (ref 70–99)
POTASSIUM: 3.9 mmol/L (ref 3.5–5.1)
Sodium: 137 mmol/L (ref 135–145)

## 2017-11-18 LAB — MAGNESIUM: Magnesium: 2.1 mg/dL (ref 1.7–2.4)

## 2017-11-18 NOTE — Progress Notes (Signed)
Pulmonary Critical Care Medicine [facility] [place]   PULMONARY CRITICAL CARE SERVICE  PROGRESS NOTE  Date of Service: 11/18/2017  Sergio Murphy  [id number]  DOB: 08-Mar-1955   DOA: 11/13/2017  Referring Physician: Carron Curie, MD  HPI: Sergio Murphy is a 62 y.o. male seen for follow up of Acute on Chronic Respiratory Failure.  Remains afebrile without distress patient is on T collar which should be able to hopefully change the trach out today  Medications: Reviewed on Rounds  Physical Exam:  Vitals: Temperature 98.4 pulse 91 respiratory 20 blood pressure 148/85 saturations 92%  Ventilator Settings of the ventilator on T collar at this time  . General: Comfortable at this time . Eyes: Grossly normal lids, irises & conjunctiva . ENT: grossly tongue is normal . Neck: no obvious mass . Cardiovascular: S1 S2 normal no gallop . Respiratory: Coarse breath sounds with a few rhonchi . Abdomen: soft . Skin: no rash seen on limited exam . Musculoskeletal: not rigid . Psychiatric:unable to assess . Neurologic: no seizure no involuntary movements         Lab Data:   Basic Metabolic Panel: Recent Labs  Lab 11/14/17 0551 11/18/17 0558  NA 135 137  K 3.8 3.9  CL 93* 94*  CO2 33* 32  GLUCOSE 158* 170*  BUN 19 23  CREATININE 0.47* 0.52*  CALCIUM 9.5 9.4  MG 2.1 2.1  PHOS 4.0  --     ABG: No results for input(s): PHART, PCO2ART, PO2ART, HCO3, O2SAT in the last 168 hours.  Liver Function Tests: Recent Labs  Lab 11/14/17 0551  AST 24  ALT 33  ALKPHOS 98  BILITOT 1.5*  PROT 8.2*  ALBUMIN 3.4*   No results for input(s): LIPASE, AMYLASE in the last 168 hours. No results for input(s): AMMONIA in the last 168 hours.  CBC: Recent Labs  Lab 11/14/17 0551 11/18/17 0558  WBC 8.1 9.6  NEUTROABS 4.6  --   HGB 17.3* 17.2*  HCT 56.9* 56.5*  MCV 88.4 88.3  PLT 225 234    Cardiac Enzymes: No results for input(s): CKTOTAL, CKMB, CKMBINDEX, TROPONINI  in the last 168 hours.  BNP (last 3 results) No results for input(s): BNP in the last 8760 hours.  ProBNP (last 3 results) No results for input(s): PROBNP in the last 8760 hours.  Radiological Exams: No results found.  Assessment/Plan Active Problems:   Acute on chronic respiratory failure with hypoxia (HCC)   Dysphagia   Severe sepsis with septic shock (HCC)   Pneumonia due to Pseudomonas (HCC)   Cerebral palsy (HCC)   Seizure disorder (HCC)   1. Acute on chronic respiratory failure with hypoxia we will continue with T collar and change the trach out today and put a #6 Shiley and try to see about capping. 2. Dysphagia speech therapy will evaluate 3. Severe sepsis with shock we will continue present therapy 4. Pneumonia due to Pseudomonas treated follow-up x-rays as necessary 5. Cerebral palsy stable monitor 6. Seizure disorder no active seizures   I have personally seen and evaluated the patient, evaluated laboratory and imaging results, formulated the assessment and plan and placed orders. The Patient requires high complexity decision making for assessment and support.  Case was discussed on Rounds with the Respiratory Therapy Staff  Yevonne Pax, MD [facility] Pulmonary Critical Care Medicine Sleep Medicine

## 2017-11-19 ENCOUNTER — Other Ambulatory Visit (HOSPITAL_COMMUNITY): Payer: Medicaid Other

## 2017-11-19 DIAGNOSIS — J151 Pneumonia due to Pseudomonas: Secondary | ICD-10-CM | POA: Diagnosis not present

## 2017-11-19 DIAGNOSIS — G809 Cerebral palsy, unspecified: Secondary | ICD-10-CM | POA: Diagnosis not present

## 2017-11-19 DIAGNOSIS — J9621 Acute and chronic respiratory failure with hypoxia: Secondary | ICD-10-CM | POA: Diagnosis not present

## 2017-11-19 DIAGNOSIS — R1310 Dysphagia, unspecified: Secondary | ICD-10-CM | POA: Diagnosis not present

## 2017-11-19 NOTE — Progress Notes (Signed)
Pulmonary Critical Care Medicine [facility] [place]   PULMONARY CRITICAL CARE SERVICE  PROGRESS NOTE  Date of Service: 11/19/2017  Sergio Murphy  WUJ:[id number]  DOB: 22-Jul-1955   DOA: 11/13/2017  Referring Physician: Carron Curie, MD  HPI: Sergio Murphy is a 62 y.o. male seen for follow up of Acute on Chronic Respiratory Failure.  Patient is capping doing well on 1 L no issues secretions are minimal to none patient has been capped since yesterday  Medications: Reviewed on Rounds  Physical Exam:  Vitals: Temperature 98.1 pulse 86 respiratory rate 16 blood pressure 146/80 saturations 98%  Ventilator Settings off the ventilator capping  . General: Comfortable at this time . Eyes: Grossly normal lids, irises & conjunctiva . ENT: grossly tongue is normal . Neck: no obvious mass . Cardiovascular: S1 S2 normal no gallop . Respiratory: Few rhonchi no rales . Abdomen: soft . Skin: no rash seen on limited exam . Musculoskeletal: not rigid . Psychiatric:unable to assess . Neurologic: no seizure no involuntary movements         Lab Data:   Basic Metabolic Panel: Recent Labs  Lab 11/14/17 0551 11/18/17 0558  NA 135 137  K 3.8 3.9  CL 93* 94*  CO2 33* 32  GLUCOSE 158* 170*  BUN 19 23  CREATININE 0.47* 0.52*  CALCIUM 9.5 9.4  MG 2.1 2.1  PHOS 4.0  --     ABG: No results for input(s): PHART, PCO2ART, PO2ART, HCO3, O2SAT in the last 168 hours.  Liver Function Tests: Recent Labs  Lab 11/14/17 0551  AST 24  ALT 33  ALKPHOS 98  BILITOT 1.5*  PROT 8.2*  ALBUMIN 3.4*   No results for input(s): LIPASE, AMYLASE in the last 168 hours. No results for input(s): AMMONIA in the last 168 hours.  CBC: Recent Labs  Lab 11/14/17 0551 11/18/17 0558  WBC 8.1 9.6  NEUTROABS 4.6  --   HGB 17.3* 17.2*  HCT 56.9* 56.5*  MCV 88.4 88.3  PLT 225 234    Cardiac Enzymes: No results for input(s): CKTOTAL, CKMB, CKMBINDEX, TROPONINI in the last 168 hours.  BNP  (last 3 results) No results for input(s): BNP in the last 8760 hours.  ProBNP (last 3 results) No results for input(s): PROBNP in the last 8760 hours.  Radiological Exams: Dg Chest Port 1 View  Result Date: 11/19/2017 CLINICAL DATA:  Shortness of breath, CHF. EXAM: PORTABLE CHEST 1 VIEW COMPARISON:  Portable chest x-ray of November 14, 2017 FINDINGS: There is persistent elevation of the left hemidiaphragm. There is patchy increased density at the left lung base and also at the right lung base. These findings are not new but on the left are more conspicuous. Again noted is nodularity in the right lower lung which may reflect a nipple shadow. The heart is mildly enlarged. The pulmonary vascularity is not engorged. The mediastinum is normal in width. The endotracheal tube tip projects between the clavicular heads. The bony thorax exhibits no acute abnormality. IMPRESSION: Interval worsening of atelectasis or pneumonia at the left lung base. Stable subsegmental atelectasis or pneumonia at the right lung base. No overt pulmonary edema. The tracheostomy tube is in reasonable position. The nipple shadows versus is lower lobe pulmonary nodule can be addressed with PA and lateral chest x-rays with nipple markers when the patient can tolerate the procedure. Alternatively, noncontrast chest CT scanning could be employed. Electronically Signed   By: David  [place] M.D.   On: 11/19/2017 08:48  Assessment/Plan Active Problems:   Acute on chronic respiratory failure with hypoxia (HCC)   Dysphagia   Severe sepsis with septic shock (HCC)   Pneumonia due to Pseudomonas (HCC)   Cerebral palsy (HCC)   Seizure disorder (HCC)   1. Acute on chronic respiratory failure with hypoxia patient continues to do well with capping there is some issues noted with the chest x-ray with some atelectasis of the right lung base needs aggressive pulmonary toilet to continue to monitor the chest films. 2. Dysphagia we will  follow-up with speech to see if he is actually able to swallow 3. Severe sepsis with shock hemodynamically stable clinically resolved 4. Pneumonia due to Pseudomonas treated follow the x-rays 5. Cerebral palsy at baseline 6. Seizure disorder no active seizures are noted   I have personally seen and evaluated the patient, evaluated laboratory and imaging results, formulated the assessment and plan and placed orders. The Patient requires high complexity decision making for assessment and support.  Case was discussed on Rounds with the Respiratory Therapy Staff  Yevonne Pax, MD [facility] Pulmonary Critical Care Medicine Sleep Medicine

## 2017-11-20 ENCOUNTER — Other Ambulatory Visit (HOSPITAL_COMMUNITY): Payer: Medicaid Other

## 2017-11-20 DIAGNOSIS — R6521 Severe sepsis with septic shock: Secondary | ICD-10-CM

## 2017-11-20 DIAGNOSIS — R1310 Dysphagia, unspecified: Secondary | ICD-10-CM

## 2017-11-20 DIAGNOSIS — G40909 Epilepsy, unspecified, not intractable, without status epilepticus: Secondary | ICD-10-CM

## 2017-11-20 DIAGNOSIS — J151 Pneumonia due to Pseudomonas: Secondary | ICD-10-CM

## 2017-11-20 DIAGNOSIS — J9621 Acute and chronic respiratory failure with hypoxia: Secondary | ICD-10-CM

## 2017-11-20 DIAGNOSIS — G809 Cerebral palsy, unspecified: Secondary | ICD-10-CM | POA: Diagnosis not present

## 2017-11-20 DIAGNOSIS — A419 Sepsis, unspecified organism: Secondary | ICD-10-CM

## 2017-11-20 LAB — BASIC METABOLIC PANEL
ANION GAP: 9 (ref 5–15)
BUN: 27 mg/dL — ABNORMAL HIGH (ref 8–23)
CALCIUM: 9.5 mg/dL (ref 8.9–10.3)
CO2: 33 mmol/L — AB (ref 22–32)
Chloride: 96 mmol/L — ABNORMAL LOW (ref 98–111)
Creatinine, Ser: 0.61 mg/dL (ref 0.61–1.24)
GFR calc Af Amer: >60 mL/min (ref >60)
Glucose, Bld: 208 mg/dL — ABNORMAL HIGH (ref 70–99)
Potassium: 3.9 mmol/L (ref 3.5–5.1)
Sodium: 138 mmol/L (ref 135–145)

## 2017-11-20 LAB — CBC
HEMATOCRIT: 56.8 % — AB (ref 39.0–52.0)
Hemoglobin: 17 g/dL (ref 13.0–17.0)
MCH: 27 pg (ref 26.0–34.0)
MCHC: 29.9 g/dL — ABNORMAL LOW (ref 30.0–36.0)
MCV: 90.2 fL (ref 80.0–100.0)
PLATELETS: 211 10*3/uL (ref 150–400)
RBC: 6.3 MIL/uL — AB (ref 4.22–5.81)
RDW: 15 % (ref 11.5–15.5)
WBC: 8.7 10*3/uL (ref 4.0–10.5)
nRBC: 0 % (ref 0.0–0.2)

## 2017-11-20 LAB — MAGNESIUM: MAGNESIUM: 2.4 mg/dL (ref 1.7–2.4)

## 2017-11-20 LAB — PHOSPHORUS: PHOSPHORUS: 3.8 mg/dL (ref 2.5–4.6)

## 2017-11-20 NOTE — Progress Notes (Signed)
Pulmonary Critical Care Medicine [facility] [place]   PULMONARY CRITICAL CARE SERVICE  PROGRESS NOTE  Date of Service: 11/20/2017  Sergio Murphy  ZOX:[id number]  DOB: 1955-04-25   DOA: 11/13/2017  Referring Physician: Carron Curie, MD  HPI: Sergio Murphy is a 62 y.o. male seen for follow up of Acute on Chronic Respiratory Failure.  Patient is doing well capping at this time has been on 2 L no distress is noted  Medications: Reviewed on Rounds  Physical Exam:  Vitals: Temperature 98.2 pulse 80 respiratory rate 16 blood pressure 146/80 saturations are 99  Ventilator Settings capping at this time doing well  . General: Comfortable at this time . Eyes: Grossly normal lids, irises & conjunctiva . ENT: grossly tongue is normal . Neck: no obvious mass . Cardiovascular: S1 S2 normal no gallop . Respiratory: No rhonchi . Abdomen: soft . Skin: no rash seen on limited exam . Musculoskeletal: not rigid . Psychiatric:unable to assess . Neurologic: no seizure no involuntary movements         Lab Data:   Basic Metabolic Panel: Recent Labs  Lab 11/14/17 0551 11/18/17 0558 11/20/17 0416  NA 135 137 138  K 3.8 3.9 3.9  CL 93* 94* 96*  CO2 33* 32 33*  GLUCOSE 158* 170* 208*  BUN 19 23 27*  CREATININE 0.47* 0.52* 0.61  CALCIUM 9.5 9.4 9.5  MG 2.1 2.1 2.4  PHOS 4.0  --  3.8    ABG: No results for input(s): PHART, PCO2ART, PO2ART, HCO3, O2SAT in the last 168 hours.  Liver Function Tests: Recent Labs  Lab 11/14/17 0551  AST 24  ALT 33  ALKPHOS 98  BILITOT 1.5*  PROT 8.2*  ALBUMIN 3.4*   No results for input(s): LIPASE, AMYLASE in the last 168 hours. No results for input(s): AMMONIA in the last 168 hours.  CBC: Recent Labs  Lab 11/14/17 0551 11/18/17 0558 11/20/17 0416  WBC 8.1 9.6 8.7  NEUTROABS 4.6  --   --   HGB 17.3* 17.2* 17.0  HCT 56.9* 56.5* 56.8*  MCV 88.4 88.3 90.2  PLT 225 234 211    Cardiac Enzymes: No results for input(s):  CKTOTAL, CKMB, CKMBINDEX, TROPONINI in the last 168 hours.  BNP (last 3 results) No results for input(s): BNP in the last 8760 hours.  ProBNP (last 3 results) No results for input(s): PROBNP in the last 8760 hours.  Radiological Exams: Dg Chest Port 1 View  Result Date: 11/19/2017 CLINICAL DATA:  Shortness of breath, CHF. EXAM: PORTABLE CHEST 1 VIEW COMPARISON:  Portable chest x-ray of November 14, 2017 FINDINGS: There is persistent elevation of the left hemidiaphragm. There is patchy increased density at the left lung base and also at the right lung base. These findings are not new but on the left are more conspicuous. Again noted is nodularity in the right lower lung which may reflect a nipple shadow. The heart is mildly enlarged. The pulmonary vascularity is not engorged. The mediastinum is normal in width. The endotracheal tube tip projects between the clavicular heads. The bony thorax exhibits no acute abnormality. IMPRESSION: Interval worsening of atelectasis or pneumonia at the left lung base. Stable subsegmental atelectasis or pneumonia at the right lung base. No overt pulmonary edema. The tracheostomy tube is in reasonable position. The nipple shadows versus is lower lobe pulmonary nodule can be addressed with PA and lateral chest x-rays with nipple markers when the patient can tolerate the procedure. Alternatively, noncontrast chest CT scanning  could be employed. Electronically Signed   By: David  [place] M.D.   On: 11/19/2017 08:48    Assessment/Plan Active Problems:   Acute on chronic respiratory failure with hypoxia (HCC)   Dysphagia   Severe sepsis with septic shock (HCC)   Pneumonia due to Pseudomonas (HCC)   Cerebral palsy (HCC)   Seizure disorder (HCC)   1. Acute on chronic respiratory failure with hypoxia we will continue to advance the weaning hopefully be able to start capping.  Continue pulmonary toilet supportive care. 2. Severe sepsis with shock hemodynamically  stable 3. Pneumonia due to Pseudomonas treated we will continue present therapy 4. Cerebral palsy at baseline continue to monitor 5. Seizure disorder no active seizure   I have personally seen and evaluated the patient, evaluated laboratory and imaging results, formulated the assessment and plan and placed orders. The Patient requires high complexity decision making for assessment and support.  Case was discussed on Rounds with the Respiratory Therapy Staff  Yevonne Pax, MD [facility] Pulmonary Critical Care Medicine Sleep Medicine

## 2017-11-21 DIAGNOSIS — J151 Pneumonia due to Pseudomonas: Secondary | ICD-10-CM | POA: Diagnosis not present

## 2017-11-21 DIAGNOSIS — G809 Cerebral palsy, unspecified: Secondary | ICD-10-CM | POA: Diagnosis not present

## 2017-11-21 DIAGNOSIS — R1310 Dysphagia, unspecified: Secondary | ICD-10-CM | POA: Diagnosis not present

## 2017-11-21 DIAGNOSIS — J9621 Acute and chronic respiratory failure with hypoxia: Secondary | ICD-10-CM | POA: Diagnosis not present

## 2017-11-21 NOTE — Progress Notes (Signed)
Pulmonary Critical Care Medicine [facility] [place]   PULMONARY CRITICAL CARE SERVICE  PROGRESS NOTE  Date of Service: 11/21/2017  Sergio Murphy  ZOX:[id number]  DOB: 1955-06-15   DOA: 11/13/2017  Referring Physician: Carron Curie, MD  HPI: Sergio Murphy is a 62 y.o. male seen for follow up of Acute on Chronic Respiratory Failure.  Patient is capping doing well has been capping for 48 hours  Medications: Reviewed on Rounds  Physical Exam:  Vitals: Temperature 97.4 pulse 82 respiratory rate 20 blood pressures 158/86 saturation 95%  Ventilator Settings capping off the ventilator  . General: Comfortable at this time . Eyes: Grossly normal lids, irises & conjunctiva . ENT: grossly tongue is normal . Neck: no obvious mass . Cardiovascular: S1 S2 normal no gallop . Respiratory: No rhonchi or rales are noted . Abdomen: soft . Skin: no rash seen on limited exam . Musculoskeletal: not rigid . Psychiatric:unable to assess . Neurologic: no seizure no involuntary movements         Lab Data:   Basic Metabolic Panel: Recent Labs  Lab 11/18/17 0558 11/20/17 0416  NA 137 138  K 3.9 3.9  CL 94* 96*  CO2 32 33*  GLUCOSE 170* 208*  BUN 23 27*  CREATININE 0.52* 0.61  CALCIUM 9.4 9.5  MG 2.1 2.4  PHOS  --  3.8    ABG: No results for input(s): PHART, PCO2ART, PO2ART, HCO3, O2SAT in the last 168 hours.  Liver Function Tests: No results for input(s): AST, ALT, ALKPHOS, BILITOT, PROT, ALBUMIN in the last 168 hours. No results for input(s): LIPASE, AMYLASE in the last 168 hours. No results for input(s): AMMONIA in the last 168 hours.  CBC: Recent Labs  Lab 11/18/17 0558 11/20/17 0416  WBC 9.6 8.7  HGB 17.2* 17.0  HCT 56.5* 56.8*  MCV 88.3 90.2  PLT 234 211    Cardiac Enzymes: No results for input(s): CKTOTAL, CKMB, CKMBINDEX, TROPONINI in the last 168 hours.  BNP (last 3 results) No results for input(s): BNP in the last 8760 hours.  ProBNP (last 3  results) No results for input(s): PROBNP in the last 8760 hours.  Radiological Exams: No results found.  Assessment/Plan Active Problems:   Acute on chronic respiratory failure with hypoxia (HCC)   Dysphagia   Severe sepsis with septic shock (HCC)   Pneumonia due to Pseudomonas (HCC)   Cerebral palsy (HCC)   Seizure disorder (HCC)   1. Acute on chronic respiratory failure with hypoxia we will continue with capping trials as ordered continue pulmonary toilet supportive care tomorrow hopefully we should be able to decannulate 2. Severe sepsis with shock resolved 3. Pneumonia due to Pseudomonas clinically improved 4. Cerebral palsy is at baseline 5. Seizure disorder no active seizures are noted at this time   I have personally seen and evaluated the patient, evaluated laboratory and imaging results, formulated the assessment and plan and placed orders. The Patient requires high complexity decision making for assessment and support.  Case was discussed on Rounds with the Respiratory Therapy Staff  Yevonne Pax, MD [facility] Pulmonary Critical Care Medicine Sleep Medicine

## 2017-11-22 DIAGNOSIS — R1310 Dysphagia, unspecified: Secondary | ICD-10-CM | POA: Diagnosis not present

## 2017-11-22 DIAGNOSIS — G809 Cerebral palsy, unspecified: Secondary | ICD-10-CM | POA: Diagnosis not present

## 2017-11-22 DIAGNOSIS — J151 Pneumonia due to Pseudomonas: Secondary | ICD-10-CM | POA: Diagnosis not present

## 2017-11-22 DIAGNOSIS — J9621 Acute and chronic respiratory failure with hypoxia: Secondary | ICD-10-CM | POA: Diagnosis not present

## 2017-11-22 NOTE — Progress Notes (Signed)
Pulmonary Critical Care Medicine [facility] [place]   PULMONARY CRITICAL CARE SERVICE  PROGRESS NOTE  Date of Service: 11/22/2017  THEADORE Murphy  ZOX:[id number]  DOB: 08/09/1955   DOA: 11/13/2017  Referring Physician: Carron Curie, MD  HPI: Sergio Murphy is a 62 y.o. male seen for follow up of Acute on Chronic Respiratory Failure.  Patient is capping at this time right now comfortable without distress  Medications: Reviewed on Rounds  Physical Exam:  Vitals: Temperature 97.6 pulse 73 respiratory rate 28 blood pressure 137/86 saturations are 97%  Ventilator Settings capping right now we will proceed to decannulate  . General: Comfortable at this time . Eyes: Grossly normal lids, irises & conjunctiva . ENT: grossly tongue is normal . Neck: no obvious mass . Cardiovascular: S1 S2 normal no gallop . Respiratory: No rhonchi or rales are noted . Abdomen: soft . Skin: no rash seen on limited exam . Musculoskeletal: not rigid . Psychiatric:unable to assess . Neurologic: no seizure no involuntary movements         Lab Data:   Basic Metabolic Panel: Recent Labs  Lab 11/18/17 0558 11/20/17 0416  NA 137 138  K 3.9 3.9  CL 94* 96*  CO2 32 33*  GLUCOSE 170* 208*  BUN 23 27*  CREATININE 0.52* 0.61  CALCIUM 9.4 9.5  MG 2.1 2.4  PHOS  --  3.8    ABG: No results for input(s): PHART, PCO2ART, PO2ART, HCO3, O2SAT in the last 168 hours.  Liver Function Tests: No results for input(s): AST, ALT, ALKPHOS, BILITOT, PROT, ALBUMIN in the last 168 hours. No results for input(s): LIPASE, AMYLASE in the last 168 hours. No results for input(s): AMMONIA in the last 168 hours.  CBC: Recent Labs  Lab 11/18/17 0558 11/20/17 0416  WBC 9.6 8.7  HGB 17.2* 17.0  HCT 56.5* 56.8*  MCV 88.3 90.2  PLT 234 211    Cardiac Enzymes: No results for input(s): CKTOTAL, CKMB, CKMBINDEX, TROPONINI in the last 168 hours.  BNP (last 3 results) No results for input(s): BNP in the  last 8760 hours.  ProBNP (last 3 results) No results for input(s): PROBNP in the last 8760 hours.  Radiological Exams: No results found.  Assessment/Plan Active Problems:   Acute on chronic respiratory failure with hypoxia (HCC)   Dysphagia   Severe sepsis with septic shock (HCC)   Pneumonia due to Pseudomonas (HCC)   Cerebral palsy (HCC)   Seizure disorder (HCC)   1. Acute on chronic respiratory failure with hypoxia doing well proceed to decannulation 2. Dysphagia stable at baseline at this time speech to follow along and do assessment 3. Severe sepsis with shock hemodynamically stable 4. Pneumonia due to Pseudomonas treated resolved 5. Cerebral palsy has chronic inability to walk continue with supportive care 6. Seizure disorder no active seizures are noted at this time   I have personally seen and evaluated the patient, evaluated laboratory and imaging results, formulated the assessment and plan and placed orders. The Patient requires high complexity decision making for assessment and support.  Case was discussed on Rounds with the Respiratory Therapy Staff  Yevonne Pax, MD [facility] Pulmonary Critical Care Medicine Sleep Medicine

## 2017-11-23 DIAGNOSIS — G809 Cerebral palsy, unspecified: Secondary | ICD-10-CM | POA: Diagnosis not present

## 2017-11-23 DIAGNOSIS — J9621 Acute and chronic respiratory failure with hypoxia: Secondary | ICD-10-CM | POA: Diagnosis not present

## 2017-11-23 DIAGNOSIS — J151 Pneumonia due to Pseudomonas: Secondary | ICD-10-CM | POA: Diagnosis not present

## 2017-11-23 DIAGNOSIS — R1310 Dysphagia, unspecified: Secondary | ICD-10-CM | POA: Diagnosis not present

## 2017-11-23 LAB — BASIC METABOLIC PANEL
Anion gap: 10 (ref 5–15)
BUN: 18 mg/dL (ref 8–23)
CHLORIDE: 96 mmol/L — AB (ref 98–111)
CO2: 31 mmol/L (ref 22–32)
CREATININE: 0.54 mg/dL — AB (ref 0.61–1.24)
Calcium: 9.2 mg/dL (ref 8.9–10.3)
GFR calc Af Amer: >60 mL/min (ref >60)
GFR calc non Af Amer: >60 mL/min (ref >60)
GLUCOSE: 111 mg/dL — AB (ref 70–99)
Potassium: 3.7 mmol/L (ref 3.5–5.1)
Sodium: 137 mmol/L (ref 135–145)

## 2017-11-23 NOTE — Progress Notes (Signed)
Pulmonary Critical Care Medicine [facility] [place]   PULMONARY CRITICAL CARE SERVICE  PROGRESS NOTE  Date of Service: 11/23/2017  Sergio Murphy  ZOX:[id number]  DOB: [birth year]   DOA: 11/13/2017  Referring Physician: Carron Curie, MD  HPI: ETAI Murphy is a 62 y.o. male seen for follow up of Acute on Chronic Respiratory Failure.  Patient is decannulated right now comfortable without distress.  He has had no major issues overnight  Medications: Reviewed on Rounds  Physical Exam:  Vitals: Temperature 97.1 pulse 78 respiratory 18 blood pressure 117/65 saturations 98%  Ventilator Settings decannulated on stoma care  . General: Comfortable at this time . Eyes: Grossly normal lids, irises & conjunctiva . ENT: grossly tongue is normal . Neck: no obvious mass . Cardiovascular: S1 S2 normal no gallop . Respiratory: Coarse breath sounds noted . Abdomen: soft . Skin: no rash seen on limited exam . Musculoskeletal: not rigid . Psychiatric:unable to assess . Neurologic: no seizure no involuntary movements         Lab Data:   Basic Metabolic Panel: Recent Labs  Lab 11/18/17 0558 11/20/17 0416 11/23/17 0657  NA 137 138 137  K 3.9 3.9 3.7  CL 94* 96* 96*  CO2 32 33* 31  GLUCOSE 170* 208* 111*  BUN 23 27* 18  CREATININE 0.52* 0.61 0.54*  CALCIUM 9.4 9.5 9.2  MG 2.1 2.4  --   PHOS  --  3.8  --     ABG: No results for input(s): PHART, PCO2ART, PO2ART, HCO3, O2SAT in the last 168 hours.  Liver Function Tests: No results for input(s): AST, ALT, ALKPHOS, BILITOT, PROT, ALBUMIN in the last 168 hours. No results for input(s): LIPASE, AMYLASE in the last 168 hours. No results for input(s): AMMONIA in the last 168 hours.  CBC: Recent Labs  Lab 11/18/17 0558 11/20/17 0416  WBC 9.6 8.7  HGB 17.2* 17.0  HCT 56.5* 56.8*  MCV 88.3 90.2  PLT 234 211    Cardiac Enzymes: No results for input(s): CKTOTAL, CKMB, CKMBINDEX, TROPONINI in the last 168 hours.  BNP  (last 3 results) No results for input(s): BNP in the last 8760 hours.  ProBNP (last 3 results) No results for input(s): PROBNP in the last 8760 hours.  Radiological Exams: No results found.  Assessment/Plan Active Problems:   Acute on chronic respiratory failure with hypoxia (HCC)   Dysphagia   Severe sepsis with septic shock (HCC)   Pneumonia due to Pseudomonas (HCC)   Cerebral palsy (HCC)   Seizure disorder (HCC)   1. Acute on chronic respiratory failure with hypoxia doing well we will continue with aggressive pulmonary toilet 2. Severe sepsis with shock hemodynamically stable we will continue present therapy 3. Pneumonia due to Pseudomonas stable will follow 4. Cerebral palsy at baseline continue present therapy 5. Seizure disorder no active seizures are noted at this time. 6. Dysphagia to be evaluated by speech on Monday   I have personally seen and evaluated the patient, evaluated laboratory and imaging results, formulated the assessment and plan and placed orders. The Patient requires high complexity decision making for assessment and support.  Case was discussed on Rounds with the Respiratory Therapy Staff  Yevonne Pax, MD [facility] Pulmonary Critical Care Medicine Sleep Medicine

## 2017-11-24 DIAGNOSIS — J151 Pneumonia due to Pseudomonas: Secondary | ICD-10-CM | POA: Diagnosis not present

## 2017-11-24 DIAGNOSIS — J9621 Acute and chronic respiratory failure with hypoxia: Secondary | ICD-10-CM | POA: Diagnosis not present

## 2017-11-24 DIAGNOSIS — R1310 Dysphagia, unspecified: Secondary | ICD-10-CM | POA: Diagnosis not present

## 2017-11-24 DIAGNOSIS — G809 Cerebral palsy, unspecified: Secondary | ICD-10-CM | POA: Diagnosis not present

## 2017-11-24 NOTE — Progress Notes (Signed)
Pulmonary Critical Care Medicine [facility] [place]   PULMONARY CRITICAL CARE SERVICE  PROGRESS NOTE  Date of Service: 11/24/2017  Sergio Murphy  ZOX:[id number]  DOB: 14-May-1955   DOA: 11/13/2017  Referring Physician: Carron Curie, MD  HPI: Sergio Murphy is a 62 y.o. male seen for follow up of Acute on Chronic Respiratory Failure.  Doing well is decannulated.  Is not been really requiring much in the way of oxygen.  Good saturations are noted.  Medications: Reviewed on Rounds  Physical Exam:  Vitals: Temperature 97.4 pulse 86 respiratory rate 16 blood pressure 153/84 saturations 98%  Ventilator Settings decannulated  . General: Comfortable at this time . Eyes: Grossly normal lids, irises & conjunctiva . ENT: grossly tongue is normal . Neck: no obvious mass . Cardiovascular: S1 S2 normal no gallop . Respiratory: No rhonchi no rales . Abdomen: soft . Skin: no rash seen on limited exam . Musculoskeletal: not rigid . Psychiatric:unable to assess . Neurologic: no seizure no involuntary movements         Lab Data:   Basic Metabolic Panel: Recent Labs  Lab 11/18/17 0558 11/20/17 0416 11/23/17 0657  NA 137 138 137  K 3.9 3.9 3.7  CL 94* 96* 96*  CO2 32 33* 31  GLUCOSE 170* 208* 111*  BUN 23 27* 18  CREATININE 0.52* 0.61 0.54*  CALCIUM 9.4 9.5 9.2  MG 2.1 2.4  --   PHOS  --  3.8  --     ABG: No results for input(s): PHART, PCO2ART, PO2ART, HCO3, O2SAT in the last 168 hours.  Liver Function Tests: No results for input(s): AST, ALT, ALKPHOS, BILITOT, PROT, ALBUMIN in the last 168 hours. No results for input(s): LIPASE, AMYLASE in the last 168 hours. No results for input(s): AMMONIA in the last 168 hours.  CBC: Recent Labs  Lab 11/18/17 0558 11/20/17 0416  WBC 9.6 8.7  HGB 17.2* 17.0  HCT 56.5* 56.8*  MCV 88.3 90.2  PLT 234 211    Cardiac Enzymes: No results for input(s): CKTOTAL, CKMB, CKMBINDEX, TROPONINI in the last 168 hours.  BNP  (last 3 results) No results for input(s): BNP in the last 8760 hours.  ProBNP (last 3 results) No results for input(s): PROBNP in the last 8760 hours.  Radiological Exams: No results found.  Assessment/Plan Active Problems:   Acute on chronic respiratory failure with hypoxia (HCC)   Dysphagia   Severe sepsis with septic shock (HCC)   Pneumonia due to Pseudomonas (HCC)   Cerebral palsy (HCC)   Seizure disorder (HCC)   1. Acute on chronic respiratory failure with hypoxia we will continue with supportive care titrate oxygen as necessary 2. Dysphagia speech to evaluate 3. Severe sepsis with shock hemodynamically stable resolved 4. Pneumonia treated resolved 5. Cerebral palsy at baseline 6. Seizure disorder no active seizures at this time   I have personally seen and evaluated the patient, evaluated laboratory and imaging results, formulated the assessment and plan and placed orders. The Patient requires high complexity decision making for assessment and support.  Case was discussed on Rounds with the Respiratory Therapy Staff  Yevonne Pax, MD [facility] Pulmonary Critical Care Medicine Sleep Medicine

## 2017-11-25 DIAGNOSIS — G809 Cerebral palsy, unspecified: Secondary | ICD-10-CM | POA: Diagnosis not present

## 2017-11-25 DIAGNOSIS — J151 Pneumonia due to Pseudomonas: Secondary | ICD-10-CM | POA: Diagnosis not present

## 2017-11-25 DIAGNOSIS — J9621 Acute and chronic respiratory failure with hypoxia: Secondary | ICD-10-CM | POA: Diagnosis not present

## 2017-11-25 DIAGNOSIS — R1310 Dysphagia, unspecified: Secondary | ICD-10-CM | POA: Diagnosis not present

## 2017-11-25 NOTE — Progress Notes (Signed)
Pulmonary Critical Care Medicine [facility] [place]   PULMONARY CRITICAL CARE SERVICE  PROGRESS NOTE  Date of Service: 11/25/2017  Sergio Murphy  [id number]:[id number]  DOB: 10/21/1955   DOA: 11/13/2017  Referring Physician: Carron Curie, MD  HPI: Sergio Murphy is a 62 y.o. male seen for follow up of Acute on Chronic Respiratory Failure.  Currently is on 2 L doing fairly well.  Working with therapy  Medications: Reviewed on Rounds  Physical Exam:  Vitals: Temperature is 97.8 pulse 88 respiratory rate 18 blood pressure is 145/84 saturation 97%  Ventilator Settings currently off the ventilator on 2 L nasal cannula  . General: Comfortable at this time . Eyes: Grossly normal lids, irises & conjunctiva . ENT: grossly tongue is normal . Neck: no obvious mass . Cardiovascular: S1 S2 normal no gallop . Respiratory: No rhonchi or rales are noted at this time . Abdomen: soft . Skin: no rash seen on limited exam . Musculoskeletal: not rigid . Psychiatric:unable to assess . Neurologic: no seizure no involuntary movements         Lab Data:   Basic Metabolic Panel: Recent Labs  Lab 11/20/17 0416 11/23/17 0657  NA 138 137  K 3.9 3.7  CL 96* 96*  CO2 33* 31  GLUCOSE 208* 111*  BUN 27* 18  CREATININE 0.61 0.54*  CALCIUM 9.5 9.2  MG 2.4  --   PHOS 3.8  --     ABG: No results for input(s): PHART, PCO2ART, PO2ART, HCO3, O2SAT in the last 168 hours.  Liver Function Tests: No results for input(s): AST, ALT, ALKPHOS, BILITOT, PROT, ALBUMIN in the last 168 hours. No results for input(s): LIPASE, AMYLASE in the last 168 hours. No results for input(s): AMMONIA in the last 168 hours.  CBC: Recent Labs  Lab 11/20/17 0416  WBC 8.7  HGB 17.0  HCT 56.8*  MCV 90.2  PLT 211    Cardiac Enzymes: No results for input(s): CKTOTAL, CKMB, CKMBINDEX, TROPONINI in the last 168 hours.  BNP (last 3 results) No results for input(s): BNP in the last 8760 hours.  ProBNP (last 3  results) No results for input(s): PROBNP in the last 8760 hours.  Radiological Exams: No results found.  Assessment/Plan Active Problems:   Acute on chronic respiratory failure with hypoxia (HCC)   Dysphagia   Severe sepsis with septic shock (HCC)   Pneumonia due to Pseudomonas (HCC)   Cerebral palsy (HCC)   Seizure disorder (HCC)   1. Acute on chronic respiratory failure with hypoxia patient on 2 L nasal cannula which will be continued.  No need for BiPAP at this time. 2. Dysphagia speech to evaluate and assess swallowing. 3. Severe sepsis with shock resolved 4. Pneumonia due to Pseudomonas treated clinically improved 5. Cerebral palsy working with physical therapy 6. Seizure disorder no active seizures are noted   I have personally seen and evaluated the patient, evaluated laboratory and imaging results, formulated the assessment and plan and placed orders. The Patient requires high complexity decision making for assessment and support.  Case was discussed on Rounds with the Respiratory Therapy Staff  Yevonne Pax, MD [facility] Pulmonary Critical Care Medicine Sleep Medicine

## 2017-11-26 ENCOUNTER — Institutional Professional Consult (permissible substitution) (HOSPITAL_COMMUNITY): Payer: Medicaid Other

## 2017-11-26 DIAGNOSIS — J9621 Acute and chronic respiratory failure with hypoxia: Secondary | ICD-10-CM | POA: Diagnosis not present

## 2017-11-26 DIAGNOSIS — R1310 Dysphagia, unspecified: Secondary | ICD-10-CM | POA: Diagnosis not present

## 2017-11-26 DIAGNOSIS — G809 Cerebral palsy, unspecified: Secondary | ICD-10-CM | POA: Diagnosis not present

## 2017-11-26 DIAGNOSIS — J151 Pneumonia due to Pseudomonas: Secondary | ICD-10-CM | POA: Diagnosis not present

## 2017-11-26 LAB — CBC
HCT: 51.3 % (ref 39.0–52.0)
HEMOGLOBIN: 15.6 g/dL (ref 13.0–17.0)
MCH: 27.1 pg (ref 26.0–34.0)
MCHC: 30.4 g/dL (ref 30.0–36.0)
MCV: 89.2 fL (ref 80.0–100.0)
NRBC: 0 % (ref 0.0–0.2)
PLATELETS: 211 10*3/uL (ref 150–400)
RBC: 5.75 MIL/uL (ref 4.22–5.81)
RDW: 14.3 % (ref 11.5–15.5)
WBC: 9.5 10*3/uL (ref 4.0–10.5)

## 2017-11-26 LAB — BASIC METABOLIC PANEL
Anion gap: 10 (ref 5–15)
BUN: 13 mg/dL (ref 8–23)
CHLORIDE: 97 mmol/L — AB (ref 98–111)
CO2: 30 mmol/L (ref 22–32)
CREATININE: 0.5 mg/dL — AB (ref 0.61–1.24)
Calcium: 9.2 mg/dL (ref 8.9–10.3)
Glucose, Bld: 138 mg/dL — ABNORMAL HIGH (ref 70–99)
POTASSIUM: 3.4 mmol/L — AB (ref 3.5–5.1)
SODIUM: 137 mmol/L (ref 135–145)

## 2017-11-26 LAB — MAGNESIUM: MAGNESIUM: 1.9 mg/dL (ref 1.7–2.4)

## 2017-11-26 MED ORDER — IOHEXOL 300 MG/ML  SOLN
100.0000 mL | Freq: Once | INTRAMUSCULAR | Status: AC | PRN
  Administered 2017-11-26: 100 mL via INTRAVENOUS

## 2017-11-26 NOTE — Progress Notes (Signed)
Pulmonary Critical Care Medicine [facility] [place]   PULMONARY CRITICAL CARE SERVICE  PROGRESS NOTE  Date of Service: 11/26/2017  Sergio Murphy  [id number]  DOB: [birth year]   DOA: 11/13/2017  Referring Physician: Carron Curie, MD  HPI: Sergio Murphy is a 62 y.o. male seen for follow up of Acute on Chronic Respiratory Failure.  Doing fairly well he is decannulated at this time.  Good saturations are noted  Medications: Reviewed on Rounds  Physical Exam:  Vitals: Temperature 97.9 pulse 80 respiratory rate 23 blood pressure 118/67 saturation 94%  Ventilator Settings off the ventilator decannulated  . General: Comfortable at this time . Eyes: Grossly normal lids, irises & conjunctiva . ENT: grossly tongue is normal . Neck: no obvious mass . Cardiovascular: S1 S2 normal no gallop . Respiratory: No rhonchi no rales . Abdomen: soft . Skin: no rash seen on limited exam . Musculoskeletal: not rigid . Psychiatric:unable to assess . Neurologic: no seizure no involuntary movements         Lab Data:   Basic Metabolic Panel: Recent Labs  Lab 11/20/17 0416 11/23/17 0657 11/26/17 0603  NA 138 137 137  K 3.9 3.7 3.4*  CL 96* 96* 97*  CO2 33* 31 30  GLUCOSE 208* 111* 138*  BUN 27* 18 13  CREATININE 0.61 0.54* 0.50*  CALCIUM 9.5 9.2 9.2  MG 2.4  --  1.9  PHOS 3.8  --   --     ABG: No results for input(s): PHART, PCO2ART, PO2ART, HCO3, O2SAT in the last 168 hours.  Liver Function Tests: No results for input(s): AST, ALT, ALKPHOS, BILITOT, PROT, ALBUMIN in the last 168 hours. No results for input(s): LIPASE, AMYLASE in the last 168 hours. No results for input(s): AMMONIA in the last 168 hours.  CBC: Recent Labs  Lab 11/20/17 0416 11/26/17 0603  WBC 8.7 9.5  HGB 17.0 15.6  HCT 56.8* 51.3  MCV 90.2 89.2  PLT 211 211    Cardiac Enzymes: No results for input(s): CKTOTAL, CKMB, CKMBINDEX, TROPONINI in the last 168 hours.  BNP (last 3 results) No  results for input(s): BNP in the last 8760 hours.  ProBNP (last 3 results) No results for input(s): PROBNP in the last 8760 hours.  Radiological Exams: No results found.  Assessment/Plan Active Problems:   Acute on chronic respiratory failure with hypoxia (HCC)   Dysphagia   Severe sepsis with septic shock (HCC)   Pneumonia due to Pseudomonas (HCC)   Cerebral palsy (HCC)   Seizure disorder (HCC)   1. Acute on chronic respiratory failure with hypoxia continue with supportive care titrate oxygen as tolerated currently on 1 to 2 L with good saturations. 2. Dysphagia speech following 3. Severe sepsis resolved 4. Pneumonia resolved 5. Cerebral palsy at baseline continue to work with therapy 6. Seizure disorder no active seizures are noted at this time   I have personally seen and evaluated the patient, evaluated laboratory and imaging results, formulated the assessment and plan and placed orders. The Patient requires high complexity decision making for assessment and support.  Case was discussed on Rounds with the Respiratory Therapy Staff  Yevonne Pax, MD [facility] Pulmonary Critical Care Medicine Sleep Medicine

## 2017-11-27 ENCOUNTER — Other Ambulatory Visit (HOSPITAL_COMMUNITY): Payer: Medicaid Other

## 2017-11-27 LAB — POTASSIUM: POTASSIUM: 4.8 mmol/L (ref 3.5–5.1)

## 2017-11-30 LAB — BASIC METABOLIC PANEL
Anion gap: 10 (ref 5–15)
BUN: 12 mg/dL (ref 8–23)
CALCIUM: 9.4 mg/dL (ref 8.9–10.3)
CO2: 30 mmol/L (ref 22–32)
CREATININE: 0.45 mg/dL — AB (ref 0.61–1.24)
Chloride: 98 mmol/L (ref 98–111)
GFR calc non Af Amer: >60 mL/min (ref >60)
Glucose, Bld: 78 mg/dL (ref 70–99)
Potassium: 4 mmol/L (ref 3.5–5.1)
SODIUM: 138 mmol/L (ref 135–145)

## 2017-11-30 LAB — CBC
HEMATOCRIT: 50.3 % (ref 39.0–52.0)
Hemoglobin: 15.3 g/dL (ref 13.0–17.0)
MCH: 27.1 pg (ref 26.0–34.0)
MCHC: 30.4 g/dL (ref 30.0–36.0)
MCV: 89 fL (ref 80.0–100.0)
PLATELETS: 244 10*3/uL (ref 150–400)
RBC: 5.65 MIL/uL (ref 4.22–5.81)
RDW: 14.6 % (ref 11.5–15.5)
WBC: 9.3 10*3/uL (ref 4.0–10.5)
nRBC: 0 % (ref 0.0–0.2)

## 2017-11-30 LAB — MAGNESIUM: Magnesium: 1.7 mg/dL (ref 1.7–2.4)

## 2017-12-01 LAB — MAGNESIUM: Magnesium: 2 mg/dL (ref 1.7–2.4)

## 2017-12-04 LAB — BASIC METABOLIC PANEL
Anion gap: 8 (ref 5–15)
BUN: 11 mg/dL (ref 8–23)
CALCIUM: 9.4 mg/dL (ref 8.9–10.3)
CHLORIDE: 98 mmol/L (ref 98–111)
CO2: 32 mmol/L (ref 22–32)
Creatinine, Ser: 0.45 mg/dL — ABNORMAL LOW (ref 0.61–1.24)
GFR calc Af Amer: >60 mL/min (ref >60)
GFR calc non Af Amer: >60 mL/min (ref >60)
GLUCOSE: 101 mg/dL — AB (ref 70–99)
Potassium: 3.7 mmol/L (ref 3.5–5.1)
Sodium: 138 mmol/L (ref 135–145)

## 2017-12-04 LAB — CBC
HEMATOCRIT: 50.4 % (ref 39.0–52.0)
HEMOGLOBIN: 15.2 g/dL (ref 13.0–17.0)
MCH: 26.8 pg (ref 26.0–34.0)
MCHC: 30.2 g/dL (ref 30.0–36.0)
MCV: 88.9 fL (ref 80.0–100.0)
Platelets: 233 10*3/uL (ref 150–400)
RBC: 5.67 MIL/uL (ref 4.22–5.81)
RDW: 14.2 % (ref 11.5–15.5)
WBC: 8.9 10*3/uL (ref 4.0–10.5)
nRBC: 0 % (ref 0.0–0.2)

## 2017-12-04 LAB — MAGNESIUM: Magnesium: 1.7 mg/dL (ref 1.7–2.4)

## 2017-12-04 LAB — PHOSPHORUS: Phosphorus: 3.6 mg/dL (ref 2.5–4.6)

## 2017-12-06 DIAGNOSIS — R531 Weakness: Secondary | ICD-10-CM | POA: Diagnosis not present

## 2017-12-06 DIAGNOSIS — Z743 Need for continuous supervision: Secondary | ICD-10-CM | POA: Diagnosis not present

## 2017-12-06 DIAGNOSIS — R7611 Nonspecific reaction to tuberculin skin test without active tuberculosis: Secondary | ICD-10-CM | POA: Diagnosis not present

## 2017-12-06 DIAGNOSIS — R279 Unspecified lack of coordination: Secondary | ICD-10-CM | POA: Diagnosis not present

## 2017-12-06 DIAGNOSIS — J9600 Acute respiratory failure, unspecified whether with hypoxia or hypercapnia: Secondary | ICD-10-CM | POA: Diagnosis not present

## 2017-12-10 DIAGNOSIS — G40909 Epilepsy, unspecified, not intractable, without status epilepticus: Secondary | ICD-10-CM | POA: Diagnosis not present

## 2017-12-10 DIAGNOSIS — E119 Type 2 diabetes mellitus without complications: Secondary | ICD-10-CM | POA: Diagnosis not present

## 2017-12-10 DIAGNOSIS — I10 Essential (primary) hypertension: Secondary | ICD-10-CM | POA: Diagnosis not present

## 2017-12-10 DIAGNOSIS — J9611 Chronic respiratory failure with hypoxia: Secondary | ICD-10-CM | POA: Diagnosis not present

## 2017-12-11 DIAGNOSIS — L409 Psoriasis, unspecified: Secondary | ICD-10-CM | POA: Diagnosis not present

## 2017-12-11 DIAGNOSIS — E1165 Type 2 diabetes mellitus with hyperglycemia: Secondary | ICD-10-CM | POA: Diagnosis not present

## 2017-12-11 DIAGNOSIS — L299 Pruritus, unspecified: Secondary | ICD-10-CM | POA: Diagnosis not present

## 2017-12-11 DIAGNOSIS — Z7689 Persons encountering health services in other specified circumstances: Secondary | ICD-10-CM | POA: Diagnosis not present

## 2017-12-11 DIAGNOSIS — G808 Other cerebral palsy: Secondary | ICD-10-CM | POA: Diagnosis not present

## 2017-12-12 DIAGNOSIS — L89893 Pressure ulcer of other site, stage 3: Secondary | ICD-10-CM | POA: Diagnosis not present

## 2017-12-17 DIAGNOSIS — Z931 Gastrostomy status: Secondary | ICD-10-CM | POA: Diagnosis not present

## 2017-12-17 DIAGNOSIS — R1312 Dysphagia, oropharyngeal phase: Secondary | ICD-10-CM | POA: Diagnosis not present

## 2017-12-17 DIAGNOSIS — G809 Cerebral palsy, unspecified: Secondary | ICD-10-CM | POA: Diagnosis not present

## 2017-12-17 DIAGNOSIS — M6281 Muscle weakness (generalized): Secondary | ICD-10-CM | POA: Diagnosis not present

## 2017-12-19 DIAGNOSIS — L89893 Pressure ulcer of other site, stage 3: Secondary | ICD-10-CM | POA: Diagnosis not present

## 2017-12-24 DIAGNOSIS — G8929 Other chronic pain: Secondary | ICD-10-CM | POA: Diagnosis not present

## 2017-12-24 DIAGNOSIS — Z931 Gastrostomy status: Secondary | ICD-10-CM | POA: Diagnosis not present

## 2017-12-24 DIAGNOSIS — G809 Cerebral palsy, unspecified: Secondary | ICD-10-CM | POA: Diagnosis not present

## 2017-12-24 DIAGNOSIS — R1310 Dysphagia, unspecified: Secondary | ICD-10-CM | POA: Diagnosis not present

## 2017-12-26 DIAGNOSIS — L89893 Pressure ulcer of other site, stage 3: Secondary | ICD-10-CM | POA: Diagnosis not present

## 2018-01-09 DIAGNOSIS — L89893 Pressure ulcer of other site, stage 3: Secondary | ICD-10-CM | POA: Diagnosis not present

## 2018-01-10 ENCOUNTER — Other Ambulatory Visit: Payer: Self-pay | Admitting: Internal Medicine

## 2018-01-10 DIAGNOSIS — R633 Feeding difficulties, unspecified: Secondary | ICD-10-CM

## 2018-01-16 ENCOUNTER — Ambulatory Visit (HOSPITAL_COMMUNITY)
Admission: RE | Admit: 2018-01-16 | Discharge: 2018-01-16 | Disposition: A | Discharge status: Home / Self Care | Payer: No Typology Code available for payment source | Source: Ambulatory Visit | Attending: Internal Medicine | Admitting: Internal Medicine

## 2018-01-16 ENCOUNTER — Encounter: Payer: Self-pay | Admitting: Physician Assistant

## 2018-01-16 DIAGNOSIS — R633 Feeding difficulties, unspecified: Secondary | ICD-10-CM

## 2018-01-16 DIAGNOSIS — Z431 Encounter for attention to gastrostomy: Secondary | ICD-10-CM | POA: Diagnosis not present

## 2018-01-16 DIAGNOSIS — Z931 Gastrostomy status: Secondary | ICD-10-CM | POA: Diagnosis not present

## 2018-01-16 DIAGNOSIS — L89893 Pressure ulcer of other site, stage 3: Secondary | ICD-10-CM | POA: Diagnosis not present

## 2018-01-16 HISTORY — PX: IR GASTROSTOMY TUBE REMOVAL: IMG5492

## 2018-01-16 MED ORDER — LIDOCAINE VISCOUS HCL 2 % MT SOLN
OROMUCOSAL | Status: DC | PRN
  Administered 2018-01-16: 15 mL via OROMUCOSAL

## 2018-01-16 MED ORDER — LIDOCAINE VISCOUS HCL 2 % MT SOLN
OROMUCOSAL | Status: AC
  Filled 2018-01-16: qty 15

## 2018-01-16 NOTE — Procedures (Signed)
Successful bedside removal of intact pull through G-tube. No immediate post procedural complications. EBL: Zero.  Please see imaging section of Epic for full dictation.  Lynnette CaffeyShannon Watterson, PA-C

## 2018-01-20 DIAGNOSIS — N319 Neuromuscular dysfunction of bladder, unspecified: Secondary | ICD-10-CM | POA: Diagnosis not present

## 2018-01-20 DIAGNOSIS — R339 Retention of urine, unspecified: Secondary | ICD-10-CM | POA: Diagnosis not present

## 2018-01-20 DIAGNOSIS — N390 Urinary tract infection, site not specified: Secondary | ICD-10-CM | POA: Diagnosis not present

## 2018-01-20 DIAGNOSIS — R309 Painful micturition, unspecified: Secondary | ICD-10-CM | POA: Diagnosis not present

## 2018-01-23 DIAGNOSIS — L89893 Pressure ulcer of other site, stage 3: Secondary | ICD-10-CM | POA: Diagnosis not present

## 2018-01-27 DIAGNOSIS — R5381 Other malaise: Secondary | ICD-10-CM | POA: Diagnosis not present

## 2018-01-27 DIAGNOSIS — G809 Cerebral palsy, unspecified: Secondary | ICD-10-CM | POA: Diagnosis not present

## 2018-01-27 DIAGNOSIS — N390 Urinary tract infection, site not specified: Secondary | ICD-10-CM | POA: Diagnosis not present

## 2018-01-27 DIAGNOSIS — R339 Retention of urine, unspecified: Secondary | ICD-10-CM | POA: Diagnosis not present

## 2018-02-03 DIAGNOSIS — G809 Cerebral palsy, unspecified: Secondary | ICD-10-CM | POA: Diagnosis not present

## 2018-02-03 DIAGNOSIS — N390 Urinary tract infection, site not specified: Secondary | ICD-10-CM | POA: Diagnosis not present

## 2018-02-03 DIAGNOSIS — R339 Retention of urine, unspecified: Secondary | ICD-10-CM | POA: Diagnosis not present

## 2018-02-03 DIAGNOSIS — R5381 Other malaise: Secondary | ICD-10-CM | POA: Diagnosis not present

## 2018-02-06 DIAGNOSIS — L89893 Pressure ulcer of other site, stage 3: Secondary | ICD-10-CM | POA: Diagnosis not present

## 2018-02-13 DIAGNOSIS — R5381 Other malaise: Secondary | ICD-10-CM | POA: Diagnosis not present

## 2018-02-13 DIAGNOSIS — L89893 Pressure ulcer of other site, stage 3: Secondary | ICD-10-CM | POA: Diagnosis not present

## 2018-02-13 DIAGNOSIS — G809 Cerebral palsy, unspecified: Secondary | ICD-10-CM | POA: Diagnosis not present

## 2018-02-13 DIAGNOSIS — N390 Urinary tract infection, site not specified: Secondary | ICD-10-CM | POA: Diagnosis not present

## 2018-02-13 DIAGNOSIS — R339 Retention of urine, unspecified: Secondary | ICD-10-CM | POA: Diagnosis not present

## 2018-02-18 DIAGNOSIS — Z87440 Personal history of urinary (tract) infections: Secondary | ICD-10-CM | POA: Diagnosis not present

## 2018-02-18 DIAGNOSIS — R5381 Other malaise: Secondary | ICD-10-CM | POA: Diagnosis not present

## 2018-02-18 DIAGNOSIS — B372 Candidiasis of skin and nail: Secondary | ICD-10-CM | POA: Diagnosis not present

## 2018-02-18 DIAGNOSIS — G809 Cerebral palsy, unspecified: Secondary | ICD-10-CM | POA: Diagnosis not present

## 2018-02-20 DIAGNOSIS — L89893 Pressure ulcer of other site, stage 3: Secondary | ICD-10-CM | POA: Diagnosis not present

## 2018-02-27 DIAGNOSIS — L89893 Pressure ulcer of other site, stage 3: Secondary | ICD-10-CM | POA: Diagnosis not present

## 2018-03-04 DIAGNOSIS — R21 Rash and other nonspecific skin eruption: Secondary | ICD-10-CM | POA: Diagnosis not present

## 2018-03-04 DIAGNOSIS — G809 Cerebral palsy, unspecified: Secondary | ICD-10-CM | POA: Diagnosis not present

## 2018-03-04 DIAGNOSIS — B372 Candidiasis of skin and nail: Secondary | ICD-10-CM | POA: Diagnosis not present

## 2018-03-04 DIAGNOSIS — Z87440 Personal history of urinary (tract) infections: Secondary | ICD-10-CM | POA: Diagnosis not present

## 2018-03-04 DIAGNOSIS — R339 Retention of urine, unspecified: Secondary | ICD-10-CM | POA: Diagnosis not present

## 2018-03-06 DIAGNOSIS — L89893 Pressure ulcer of other site, stage 3: Secondary | ICD-10-CM | POA: Diagnosis not present

## 2018-03-09 DIAGNOSIS — R509 Fever, unspecified: Secondary | ICD-10-CM | POA: Diagnosis not present

## 2018-03-09 DIAGNOSIS — R000 Tachycardia, unspecified: Secondary | ICD-10-CM | POA: Diagnosis not present

## 2018-03-10 ENCOUNTER — Encounter (HOSPITAL_COMMUNITY): Payer: Self-pay | Admitting: Physician Assistant

## 2018-03-10 DIAGNOSIS — J189 Pneumonia, unspecified organism: Secondary | ICD-10-CM | POA: Diagnosis not present

## 2018-03-10 DIAGNOSIS — R5381 Other malaise: Secondary | ICD-10-CM | POA: Diagnosis not present

## 2018-03-10 DIAGNOSIS — E860 Dehydration: Secondary | ICD-10-CM | POA: Diagnosis not present

## 2018-03-10 DIAGNOSIS — R509 Fever, unspecified: Secondary | ICD-10-CM | POA: Diagnosis not present

## 2018-03-10 DIAGNOSIS — D72829 Elevated white blood cell count, unspecified: Secondary | ICD-10-CM | POA: Diagnosis not present

## 2018-03-13 DIAGNOSIS — L89893 Pressure ulcer of other site, stage 3: Secondary | ICD-10-CM | POA: Diagnosis not present

## 2018-03-16 DIAGNOSIS — G809 Cerebral palsy, unspecified: Secondary | ICD-10-CM | POA: Diagnosis not present

## 2018-03-16 DIAGNOSIS — J189 Pneumonia, unspecified organism: Secondary | ICD-10-CM | POA: Diagnosis not present

## 2018-03-16 DIAGNOSIS — R339 Retention of urine, unspecified: Secondary | ICD-10-CM | POA: Diagnosis not present

## 2018-03-16 DIAGNOSIS — N390 Urinary tract infection, site not specified: Secondary | ICD-10-CM | POA: Diagnosis not present

## 2018-03-16 DIAGNOSIS — Z87440 Personal history of urinary (tract) infections: Secondary | ICD-10-CM | POA: Diagnosis not present

## 2018-03-16 DIAGNOSIS — J9620 Acute and chronic respiratory failure, unspecified whether with hypoxia or hypercapnia: Secondary | ICD-10-CM | POA: Diagnosis not present

## 2018-03-16 DIAGNOSIS — R1310 Dysphagia, unspecified: Secondary | ICD-10-CM | POA: Diagnosis not present

## 2018-03-16 DIAGNOSIS — L89893 Pressure ulcer of other site, stage 3: Secondary | ICD-10-CM | POA: Diagnosis not present

## 2018-03-16 DIAGNOSIS — I10 Essential (primary) hypertension: Secondary | ICD-10-CM | POA: Diagnosis not present

## 2018-03-16 DIAGNOSIS — R319 Hematuria, unspecified: Secondary | ICD-10-CM | POA: Diagnosis not present

## 2018-03-16 DIAGNOSIS — N189 Chronic kidney disease, unspecified: Secondary | ICD-10-CM | POA: Diagnosis not present

## 2018-03-16 DIAGNOSIS — N139 Obstructive and reflux uropathy, unspecified: Secondary | ICD-10-CM | POA: Diagnosis not present

## 2018-03-16 DIAGNOSIS — B372 Candidiasis of skin and nail: Secondary | ICD-10-CM | POA: Diagnosis not present

## 2018-03-16 DIAGNOSIS — M6281 Muscle weakness (generalized): Secondary | ICD-10-CM | POA: Diagnosis not present

## 2018-03-20 DIAGNOSIS — L89893 Pressure ulcer of other site, stage 3: Secondary | ICD-10-CM | POA: Diagnosis not present

## 2018-04-01 DIAGNOSIS — R339 Retention of urine, unspecified: Secondary | ICD-10-CM | POA: Diagnosis not present

## 2018-04-01 DIAGNOSIS — B372 Candidiasis of skin and nail: Secondary | ICD-10-CM | POA: Diagnosis not present

## 2018-04-01 DIAGNOSIS — G809 Cerebral palsy, unspecified: Secondary | ICD-10-CM | POA: Diagnosis not present

## 2018-04-01 DIAGNOSIS — Z87440 Personal history of urinary (tract) infections: Secondary | ICD-10-CM | POA: Diagnosis not present

## 2018-04-04 DIAGNOSIS — R319 Hematuria, unspecified: Secondary | ICD-10-CM | POA: Diagnosis not present

## 2018-04-04 DIAGNOSIS — R339 Retention of urine, unspecified: Secondary | ICD-10-CM | POA: Diagnosis not present

## 2018-04-04 DIAGNOSIS — B372 Candidiasis of skin and nail: Secondary | ICD-10-CM | POA: Diagnosis not present

## 2018-04-04 DIAGNOSIS — G809 Cerebral palsy, unspecified: Secondary | ICD-10-CM | POA: Diagnosis not present

## 2018-04-08 DIAGNOSIS — R3914 Feeling of incomplete bladder emptying: Secondary | ICD-10-CM | POA: Diagnosis not present

## 2018-04-08 DIAGNOSIS — R351 Nocturia: Secondary | ICD-10-CM | POA: Diagnosis not present

## 2018-04-08 DIAGNOSIS — R350 Frequency of micturition: Secondary | ICD-10-CM | POA: Diagnosis not present

## 2018-04-29 DIAGNOSIS — L409 Psoriasis, unspecified: Secondary | ICD-10-CM | POA: Diagnosis not present

## 2018-04-29 DIAGNOSIS — M6281 Muscle weakness (generalized): Secondary | ICD-10-CM | POA: Diagnosis not present

## 2018-04-29 DIAGNOSIS — G4089 Other seizures: Secondary | ICD-10-CM | POA: Diagnosis not present

## 2018-04-29 DIAGNOSIS — E1165 Type 2 diabetes mellitus with hyperglycemia: Secondary | ICD-10-CM | POA: Diagnosis not present

## 2018-04-29 DIAGNOSIS — G809 Cerebral palsy, unspecified: Secondary | ICD-10-CM | POA: Diagnosis not present

## 2018-04-29 DIAGNOSIS — R339 Retention of urine, unspecified: Secondary | ICD-10-CM | POA: Diagnosis not present

## 2018-04-29 DIAGNOSIS — I10 Essential (primary) hypertension: Secondary | ICD-10-CM | POA: Diagnosis not present

## 2018-04-30 DIAGNOSIS — E119 Type 2 diabetes mellitus without complications: Secondary | ICD-10-CM | POA: Diagnosis not present

## 2018-04-30 DIAGNOSIS — Z139 Encounter for screening, unspecified: Secondary | ICD-10-CM | POA: Diagnosis not present

## 2018-04-30 DIAGNOSIS — E039 Hypothyroidism, unspecified: Secondary | ICD-10-CM | POA: Diagnosis not present

## 2018-04-30 DIAGNOSIS — D649 Anemia, unspecified: Secondary | ICD-10-CM | POA: Diagnosis not present

## 2018-04-30 DIAGNOSIS — I10 Essential (primary) hypertension: Secondary | ICD-10-CM | POA: Diagnosis not present

## 2018-04-30 DIAGNOSIS — Z79899 Other long term (current) drug therapy: Secondary | ICD-10-CM | POA: Diagnosis not present

## 2018-04-30 DIAGNOSIS — E785 Hyperlipidemia, unspecified: Secondary | ICD-10-CM | POA: Diagnosis not present

## 2018-04-30 DIAGNOSIS — N390 Urinary tract infection, site not specified: Secondary | ICD-10-CM | POA: Diagnosis not present

## 2018-04-30 DIAGNOSIS — N189 Chronic kidney disease, unspecified: Secondary | ICD-10-CM | POA: Diagnosis not present

## 2018-04-30 DIAGNOSIS — E559 Vitamin D deficiency, unspecified: Secondary | ICD-10-CM | POA: Diagnosis not present

## 2018-04-30 DIAGNOSIS — R799 Abnormal finding of blood chemistry, unspecified: Secondary | ICD-10-CM | POA: Diagnosis not present

## 2018-05-01 DIAGNOSIS — R3981 Functional urinary incontinence: Secondary | ICD-10-CM | POA: Diagnosis not present

## 2018-05-01 DIAGNOSIS — E876 Hypokalemia: Secondary | ICD-10-CM | POA: Diagnosis not present

## 2018-05-01 DIAGNOSIS — E559 Vitamin D deficiency, unspecified: Secondary | ICD-10-CM | POA: Diagnosis not present

## 2018-05-01 DIAGNOSIS — M6281 Muscle weakness (generalized): Secondary | ICD-10-CM | POA: Diagnosis not present

## 2018-05-05 DIAGNOSIS — Z139 Encounter for screening, unspecified: Secondary | ICD-10-CM | POA: Diagnosis not present

## 2018-05-05 DIAGNOSIS — R799 Abnormal finding of blood chemistry, unspecified: Secondary | ICD-10-CM | POA: Diagnosis not present

## 2018-05-05 DIAGNOSIS — N390 Urinary tract infection, site not specified: Secondary | ICD-10-CM | POA: Diagnosis not present

## 2018-05-05 DIAGNOSIS — D519 Vitamin B12 deficiency anemia, unspecified: Secondary | ICD-10-CM | POA: Diagnosis not present

## 2018-05-05 DIAGNOSIS — N189 Chronic kidney disease, unspecified: Secondary | ICD-10-CM | POA: Diagnosis not present

## 2018-05-07 DIAGNOSIS — R3981 Functional urinary incontinence: Secondary | ICD-10-CM | POA: Diagnosis not present

## 2018-05-07 DIAGNOSIS — L22 Diaper dermatitis: Secondary | ICD-10-CM | POA: Diagnosis not present

## 2018-05-19 DIAGNOSIS — G809 Cerebral palsy, unspecified: Secondary | ICD-10-CM | POA: Diagnosis not present

## 2018-05-19 DIAGNOSIS — L02412 Cutaneous abscess of left axilla: Secondary | ICD-10-CM | POA: Diagnosis not present

## 2018-05-19 DIAGNOSIS — R339 Retention of urine, unspecified: Secondary | ICD-10-CM | POA: Diagnosis not present

## 2018-05-19 DIAGNOSIS — G8220 Paraplegia, unspecified: Secondary | ICD-10-CM | POA: Diagnosis not present

## 2018-05-21 DIAGNOSIS — N189 Chronic kidney disease, unspecified: Secondary | ICD-10-CM | POA: Diagnosis not present

## 2018-05-21 DIAGNOSIS — Z139 Encounter for screening, unspecified: Secondary | ICD-10-CM | POA: Diagnosis not present

## 2018-05-21 DIAGNOSIS — D72829 Elevated white blood cell count, unspecified: Secondary | ICD-10-CM | POA: Diagnosis not present

## 2018-05-21 DIAGNOSIS — E1165 Type 2 diabetes mellitus with hyperglycemia: Secondary | ICD-10-CM | POA: Diagnosis not present

## 2018-05-21 DIAGNOSIS — L02412 Cutaneous abscess of left axilla: Secondary | ICD-10-CM | POA: Diagnosis not present

## 2018-05-21 DIAGNOSIS — E119 Type 2 diabetes mellitus without complications: Secondary | ICD-10-CM | POA: Diagnosis not present

## 2018-05-22 DIAGNOSIS — L98499 Non-pressure chronic ulcer of skin of other sites with unspecified severity: Secondary | ICD-10-CM | POA: Diagnosis not present

## 2018-05-28 DIAGNOSIS — M6281 Muscle weakness (generalized): Secondary | ICD-10-CM | POA: Diagnosis not present

## 2018-05-28 DIAGNOSIS — J9620 Acute and chronic respiratory failure, unspecified whether with hypoxia or hypercapnia: Secondary | ICD-10-CM | POA: Diagnosis not present

## 2018-05-28 DIAGNOSIS — G809 Cerebral palsy, unspecified: Secondary | ICD-10-CM | POA: Diagnosis not present

## 2018-05-29 DIAGNOSIS — J9620 Acute and chronic respiratory failure, unspecified whether with hypoxia or hypercapnia: Secondary | ICD-10-CM | POA: Diagnosis not present

## 2018-05-29 DIAGNOSIS — M6281 Muscle weakness (generalized): Secondary | ICD-10-CM | POA: Diagnosis not present

## 2018-05-29 DIAGNOSIS — G809 Cerebral palsy, unspecified: Secondary | ICD-10-CM | POA: Diagnosis not present

## 2018-05-29 DIAGNOSIS — L98499 Non-pressure chronic ulcer of skin of other sites with unspecified severity: Secondary | ICD-10-CM | POA: Diagnosis not present

## 2018-05-30 DIAGNOSIS — M6281 Muscle weakness (generalized): Secondary | ICD-10-CM | POA: Diagnosis not present

## 2018-05-30 DIAGNOSIS — E1140 Type 2 diabetes mellitus with diabetic neuropathy, unspecified: Secondary | ICD-10-CM | POA: Diagnosis not present

## 2018-05-30 DIAGNOSIS — Z6832 Body mass index (BMI) 32.0-32.9, adult: Secondary | ICD-10-CM | POA: Diagnosis not present

## 2018-05-30 DIAGNOSIS — F339 Major depressive disorder, recurrent, unspecified: Secondary | ICD-10-CM | POA: Diagnosis not present

## 2018-05-30 DIAGNOSIS — L409 Psoriasis, unspecified: Secondary | ICD-10-CM | POA: Diagnosis not present

## 2018-05-30 DIAGNOSIS — L02412 Cutaneous abscess of left axilla: Secondary | ICD-10-CM | POA: Diagnosis not present

## 2018-05-30 DIAGNOSIS — J9620 Acute and chronic respiratory failure, unspecified whether with hypoxia or hypercapnia: Secondary | ICD-10-CM | POA: Diagnosis not present

## 2018-05-30 DIAGNOSIS — G809 Cerebral palsy, unspecified: Secondary | ICD-10-CM | POA: Diagnosis not present

## 2018-05-30 DIAGNOSIS — I10 Essential (primary) hypertension: Secondary | ICD-10-CM | POA: Diagnosis not present

## 2018-06-01 DIAGNOSIS — M6281 Muscle weakness (generalized): Secondary | ICD-10-CM | POA: Diagnosis not present

## 2018-06-01 DIAGNOSIS — J9620 Acute and chronic respiratory failure, unspecified whether with hypoxia or hypercapnia: Secondary | ICD-10-CM | POA: Diagnosis not present

## 2018-06-01 DIAGNOSIS — G809 Cerebral palsy, unspecified: Secondary | ICD-10-CM | POA: Diagnosis not present

## 2018-06-02 DIAGNOSIS — G809 Cerebral palsy, unspecified: Secondary | ICD-10-CM | POA: Diagnosis not present

## 2018-06-02 DIAGNOSIS — M6281 Muscle weakness (generalized): Secondary | ICD-10-CM | POA: Diagnosis not present

## 2018-06-02 DIAGNOSIS — J9620 Acute and chronic respiratory failure, unspecified whether with hypoxia or hypercapnia: Secondary | ICD-10-CM | POA: Diagnosis not present

## 2018-06-04 DIAGNOSIS — I10 Essential (primary) hypertension: Secondary | ICD-10-CM | POA: Diagnosis not present

## 2018-06-04 DIAGNOSIS — G809 Cerebral palsy, unspecified: Secondary | ICD-10-CM | POA: Diagnosis not present

## 2018-06-04 DIAGNOSIS — M6281 Muscle weakness (generalized): Secondary | ICD-10-CM | POA: Diagnosis not present

## 2018-06-04 DIAGNOSIS — R031 Nonspecific low blood-pressure reading: Secondary | ICD-10-CM | POA: Diagnosis not present

## 2018-06-04 DIAGNOSIS — J9620 Acute and chronic respiratory failure, unspecified whether with hypoxia or hypercapnia: Secondary | ICD-10-CM | POA: Diagnosis not present

## 2018-06-05 DIAGNOSIS — L98499 Non-pressure chronic ulcer of skin of other sites with unspecified severity: Secondary | ICD-10-CM | POA: Diagnosis not present

## 2018-06-09 DIAGNOSIS — E1165 Type 2 diabetes mellitus with hyperglycemia: Secondary | ICD-10-CM | POA: Diagnosis not present

## 2018-06-09 DIAGNOSIS — L02412 Cutaneous abscess of left axilla: Secondary | ICD-10-CM | POA: Diagnosis not present

## 2018-06-10 DIAGNOSIS — E1140 Type 2 diabetes mellitus with diabetic neuropathy, unspecified: Secondary | ICD-10-CM | POA: Diagnosis not present

## 2018-06-10 DIAGNOSIS — G809 Cerebral palsy, unspecified: Secondary | ICD-10-CM | POA: Diagnosis not present

## 2018-06-10 DIAGNOSIS — L02412 Cutaneous abscess of left axilla: Secondary | ICD-10-CM | POA: Diagnosis not present

## 2018-06-10 DIAGNOSIS — G8929 Other chronic pain: Secondary | ICD-10-CM | POA: Diagnosis not present

## 2018-06-10 DIAGNOSIS — R451 Restlessness and agitation: Secondary | ICD-10-CM | POA: Diagnosis not present

## 2018-06-10 DIAGNOSIS — F419 Anxiety disorder, unspecified: Secondary | ICD-10-CM | POA: Diagnosis not present

## 2018-06-11 DIAGNOSIS — N189 Chronic kidney disease, unspecified: Secondary | ICD-10-CM | POA: Diagnosis not present

## 2018-06-11 DIAGNOSIS — D649 Anemia, unspecified: Secondary | ICD-10-CM | POA: Diagnosis not present

## 2018-06-11 DIAGNOSIS — N390 Urinary tract infection, site not specified: Secondary | ICD-10-CM | POA: Diagnosis not present

## 2018-06-11 DIAGNOSIS — Z139 Encounter for screening, unspecified: Secondary | ICD-10-CM | POA: Diagnosis not present

## 2018-06-11 DIAGNOSIS — I10 Essential (primary) hypertension: Secondary | ICD-10-CM | POA: Diagnosis not present

## 2018-06-11 DIAGNOSIS — E119 Type 2 diabetes mellitus without complications: Secondary | ICD-10-CM | POA: Diagnosis not present

## 2018-06-12 DIAGNOSIS — L98499 Non-pressure chronic ulcer of skin of other sites with unspecified severity: Secondary | ICD-10-CM | POA: Diagnosis not present

## 2018-06-13 DIAGNOSIS — F419 Anxiety disorder, unspecified: Secondary | ICD-10-CM | POA: Diagnosis not present

## 2018-06-13 DIAGNOSIS — G8929 Other chronic pain: Secondary | ICD-10-CM | POA: Diagnosis not present

## 2018-06-13 DIAGNOSIS — G809 Cerebral palsy, unspecified: Secondary | ICD-10-CM | POA: Diagnosis not present

## 2018-06-13 DIAGNOSIS — M62838 Other muscle spasm: Secondary | ICD-10-CM | POA: Diagnosis not present

## 2018-06-13 DIAGNOSIS — G8220 Paraplegia, unspecified: Secondary | ICD-10-CM | POA: Diagnosis not present

## 2018-06-13 DIAGNOSIS — L02412 Cutaneous abscess of left axilla: Secondary | ICD-10-CM | POA: Diagnosis not present

## 2018-06-17 DIAGNOSIS — M545 Low back pain: Secondary | ICD-10-CM | POA: Diagnosis not present

## 2018-06-17 DIAGNOSIS — G8929 Other chronic pain: Secondary | ICD-10-CM | POA: Diagnosis not present

## 2018-06-17 DIAGNOSIS — M6281 Muscle weakness (generalized): Secondary | ICD-10-CM | POA: Diagnosis not present

## 2018-06-17 DIAGNOSIS — R252 Cramp and spasm: Secondary | ICD-10-CM | POA: Diagnosis not present

## 2018-06-19 DIAGNOSIS — L98499 Non-pressure chronic ulcer of skin of other sites with unspecified severity: Secondary | ICD-10-CM | POA: Diagnosis not present

## 2018-07-07 DIAGNOSIS — B351 Tinea unguium: Secondary | ICD-10-CM | POA: Diagnosis not present

## 2018-07-07 DIAGNOSIS — S91201A Unspecified open wound of right great toe with damage to nail, initial encounter: Secondary | ICD-10-CM | POA: Diagnosis not present

## 2018-07-07 DIAGNOSIS — I10 Essential (primary) hypertension: Secondary | ICD-10-CM | POA: Diagnosis not present

## 2018-07-07 DIAGNOSIS — E1140 Type 2 diabetes mellitus with diabetic neuropathy, unspecified: Secondary | ICD-10-CM | POA: Diagnosis not present

## 2018-07-11 DIAGNOSIS — F331 Major depressive disorder, recurrent, moderate: Secondary | ICD-10-CM | POA: Diagnosis not present

## 2018-07-11 DIAGNOSIS — F411 Generalized anxiety disorder: Secondary | ICD-10-CM | POA: Diagnosis not present

## 2018-07-23 DIAGNOSIS — G8220 Paraplegia, unspecified: Secondary | ICD-10-CM | POA: Diagnosis not present

## 2018-07-23 DIAGNOSIS — G8929 Other chronic pain: Secondary | ICD-10-CM | POA: Diagnosis not present

## 2018-07-23 DIAGNOSIS — M62838 Other muscle spasm: Secondary | ICD-10-CM | POA: Diagnosis not present

## 2018-07-23 DIAGNOSIS — G809 Cerebral palsy, unspecified: Secondary | ICD-10-CM | POA: Diagnosis not present

## 2018-08-08 DIAGNOSIS — F331 Major depressive disorder, recurrent, moderate: Secondary | ICD-10-CM | POA: Diagnosis not present

## 2018-08-08 DIAGNOSIS — F411 Generalized anxiety disorder: Secondary | ICD-10-CM | POA: Diagnosis not present

## 2018-09-05 DIAGNOSIS — F411 Generalized anxiety disorder: Secondary | ICD-10-CM | POA: Diagnosis not present

## 2018-09-05 DIAGNOSIS — F339 Major depressive disorder, recurrent, unspecified: Secondary | ICD-10-CM | POA: Diagnosis not present

## 2018-09-07 DIAGNOSIS — M25562 Pain in left knee: Secondary | ICD-10-CM | POA: Diagnosis not present

## 2018-09-07 DIAGNOSIS — M25561 Pain in right knee: Secondary | ICD-10-CM | POA: Diagnosis not present

## 2018-09-09 DIAGNOSIS — R609 Edema, unspecified: Secondary | ICD-10-CM | POA: Diagnosis not present

## 2018-09-09 DIAGNOSIS — G809 Cerebral palsy, unspecified: Secondary | ICD-10-CM | POA: Diagnosis not present

## 2018-09-09 DIAGNOSIS — S80211A Abrasion, right knee, initial encounter: Secondary | ICD-10-CM | POA: Diagnosis not present

## 2018-09-09 DIAGNOSIS — G8929 Other chronic pain: Secondary | ICD-10-CM | POA: Diagnosis not present

## 2018-09-09 DIAGNOSIS — W19XXXA Unspecified fall, initial encounter: Secondary | ICD-10-CM | POA: Diagnosis not present

## 2018-09-10 DIAGNOSIS — Z20828 Contact with and (suspected) exposure to other viral communicable diseases: Secondary | ICD-10-CM | POA: Diagnosis not present

## 2018-09-11 DIAGNOSIS — E1140 Type 2 diabetes mellitus with diabetic neuropathy, unspecified: Secondary | ICD-10-CM | POA: Diagnosis not present

## 2018-09-11 DIAGNOSIS — I10 Essential (primary) hypertension: Secondary | ICD-10-CM | POA: Diagnosis not present

## 2018-09-11 DIAGNOSIS — G809 Cerebral palsy, unspecified: Secondary | ICD-10-CM | POA: Diagnosis not present

## 2018-09-11 DIAGNOSIS — R339 Retention of urine, unspecified: Secondary | ICD-10-CM | POA: Diagnosis not present

## 2018-09-11 DIAGNOSIS — Z6834 Body mass index (BMI) 34.0-34.9, adult: Secondary | ICD-10-CM | POA: Diagnosis not present

## 2018-09-11 DIAGNOSIS — L409 Psoriasis, unspecified: Secondary | ICD-10-CM | POA: Diagnosis not present

## 2018-09-12 DIAGNOSIS — Z5181 Encounter for therapeutic drug level monitoring: Secondary | ICD-10-CM | POA: Diagnosis not present

## 2018-09-12 DIAGNOSIS — Z139 Encounter for screening, unspecified: Secondary | ICD-10-CM | POA: Diagnosis not present

## 2018-09-23 DIAGNOSIS — E1140 Type 2 diabetes mellitus with diabetic neuropathy, unspecified: Secondary | ICD-10-CM | POA: Diagnosis not present

## 2018-09-23 DIAGNOSIS — G809 Cerebral palsy, unspecified: Secondary | ICD-10-CM | POA: Diagnosis not present

## 2018-09-23 DIAGNOSIS — G8929 Other chronic pain: Secondary | ICD-10-CM | POA: Diagnosis not present

## 2018-09-23 DIAGNOSIS — F419 Anxiety disorder, unspecified: Secondary | ICD-10-CM | POA: Diagnosis not present

## 2018-09-30 DIAGNOSIS — Z9119 Patient's noncompliance with other medical treatment and regimen: Secondary | ICD-10-CM | POA: Diagnosis not present

## 2018-09-30 DIAGNOSIS — E1140 Type 2 diabetes mellitus with diabetic neuropathy, unspecified: Secondary | ICD-10-CM | POA: Diagnosis not present

## 2018-10-03 DIAGNOSIS — F339 Major depressive disorder, recurrent, unspecified: Secondary | ICD-10-CM | POA: Diagnosis not present

## 2018-10-03 DIAGNOSIS — F411 Generalized anxiety disorder: Secondary | ICD-10-CM | POA: Diagnosis not present

## 2018-10-31 DIAGNOSIS — F331 Major depressive disorder, recurrent, moderate: Secondary | ICD-10-CM | POA: Diagnosis not present

## 2018-10-31 DIAGNOSIS — F411 Generalized anxiety disorder: Secondary | ICD-10-CM | POA: Diagnosis not present

## 2018-11-24 DIAGNOSIS — F339 Major depressive disorder, recurrent, unspecified: Secondary | ICD-10-CM | POA: Diagnosis not present

## 2018-12-04 DIAGNOSIS — F411 Generalized anxiety disorder: Secondary | ICD-10-CM | POA: Diagnosis not present

## 2018-12-04 DIAGNOSIS — F331 Major depressive disorder, recurrent, moderate: Secondary | ICD-10-CM | POA: Diagnosis not present

## 2018-12-05 IMAGING — CT CT ABD-PELV W/ CM
2 of 5 series · 16 of 46 positions shown, 18 images · IV contrast (Omni 300)
Comparison: None.

CLINICAL DATA: Sepsis, abdominal pain. Acute on chronic respiratory
failure.

EXAM:
CT ABDOMEN AND PELVIS WITH CONTRAST
TECHNIQUE: Multidetector CT imaging of the abdomen and pelvis was performed
using the standard protocol following bolus administration of
intravenous contrast.
CONTRAST:  100mL OMNIPAQUE IOHEXOL 300 MG/ML  SOLN

[Series 3: a/p w/ 5mm · axial · 0.85mm/px · z∈[-312,+168]mm · 13 of 110 slices shown, 15 images]
[im 7/110  soft-tissue]
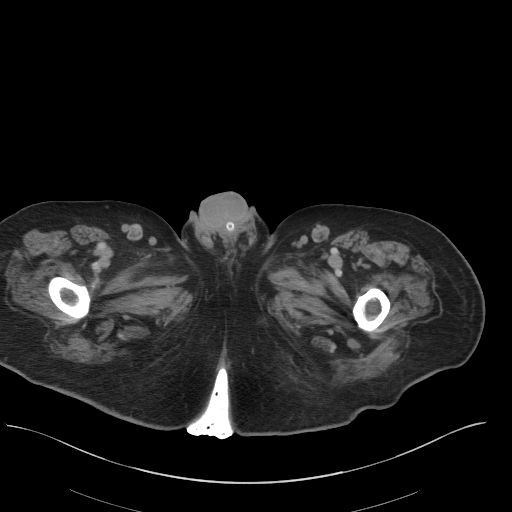
[im 7/110  bone]
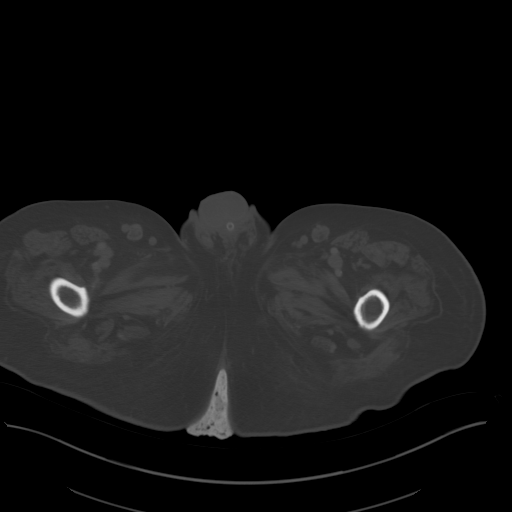
[im 14/110  soft-tissue]
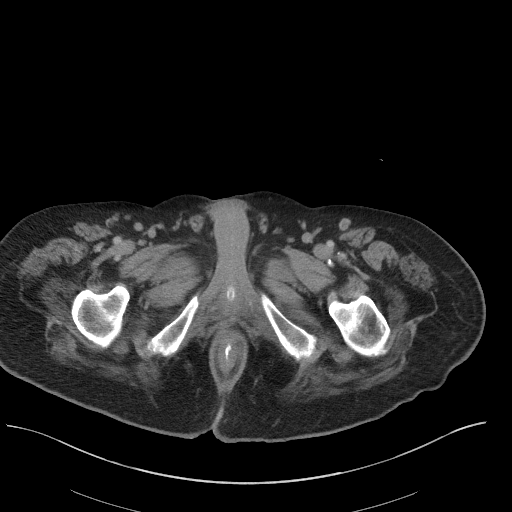
[im 21/110  soft-tissue]
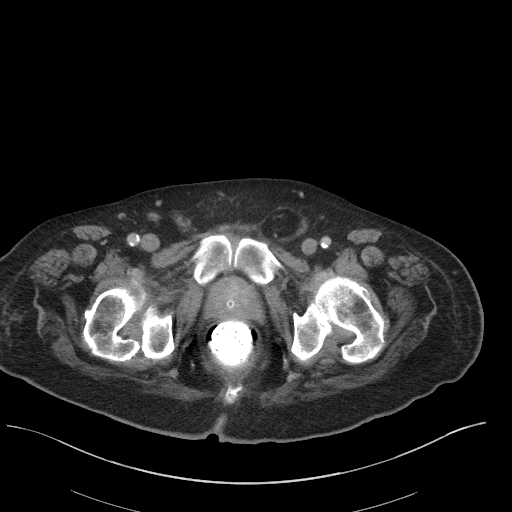
[im 35/110  soft-tissue]
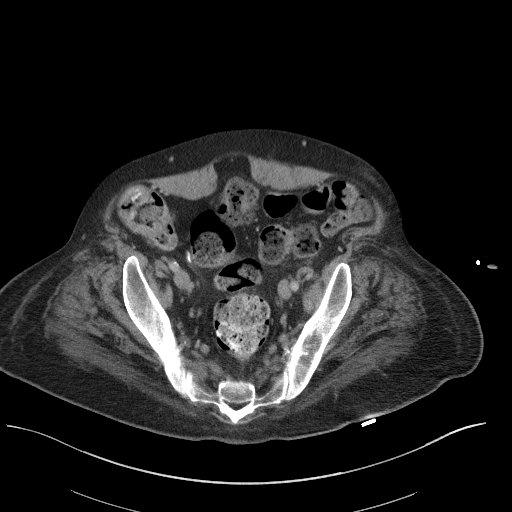
[im 41/110  soft-tissue]
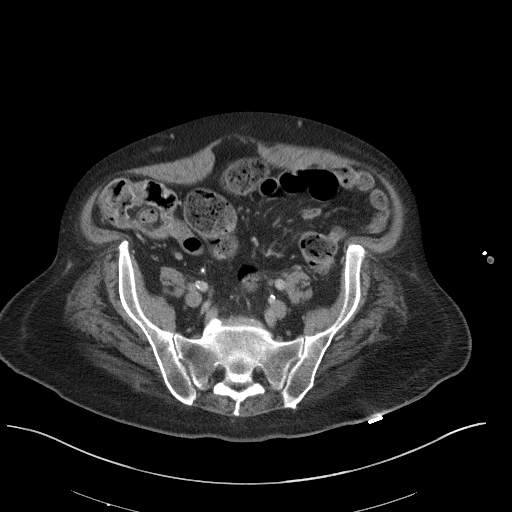
[im 48/110  soft-tissue]
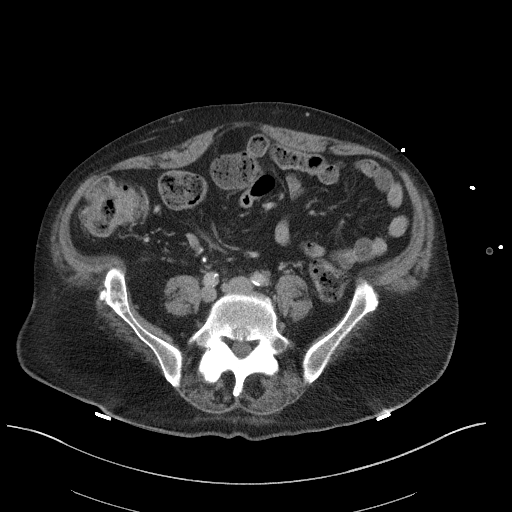
[im 55/110  soft-tissue]
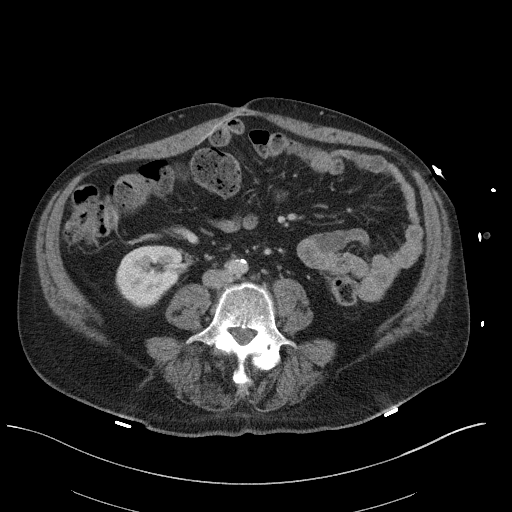
[im 62/110  soft-tissue]
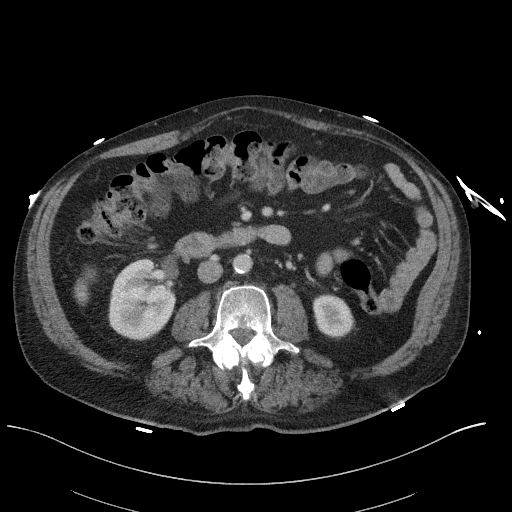
[im 69/110  soft-tissue]
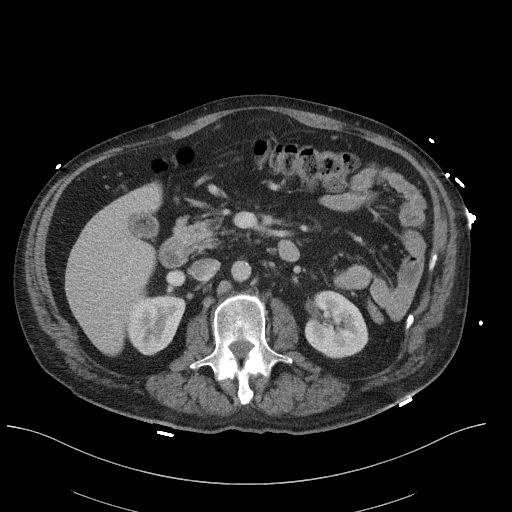
[im 69/110  bone]
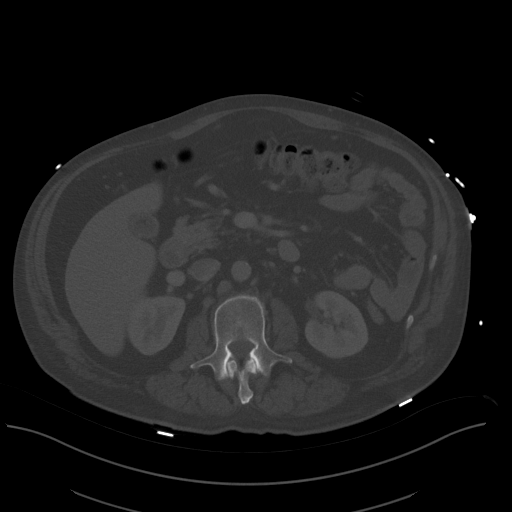
[im 75/110  soft-tissue]
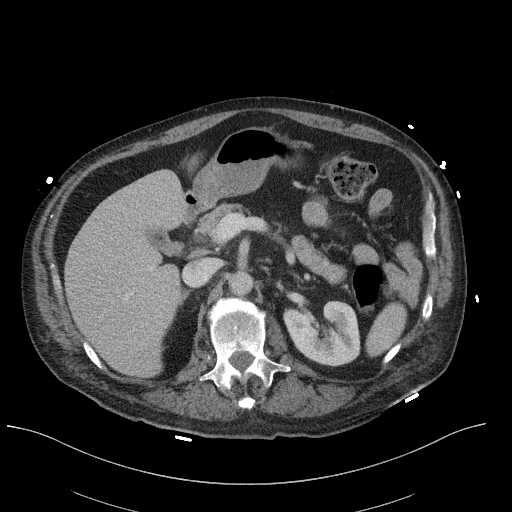
[im 89/110  soft-tissue]
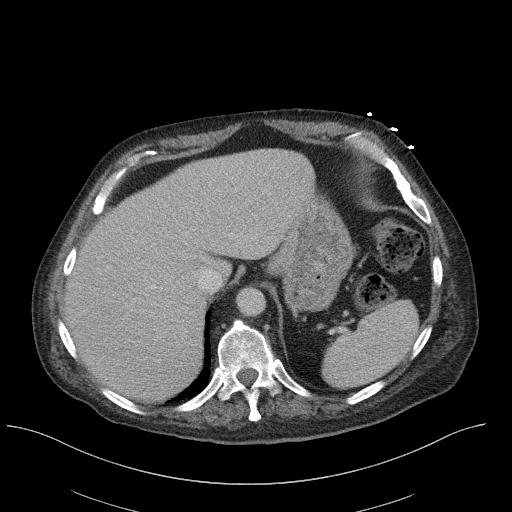
[im 96/110  soft-tissue]
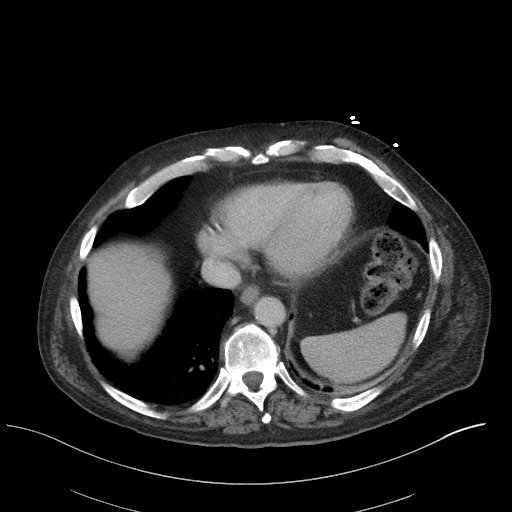
[im 103/110  soft-tissue]
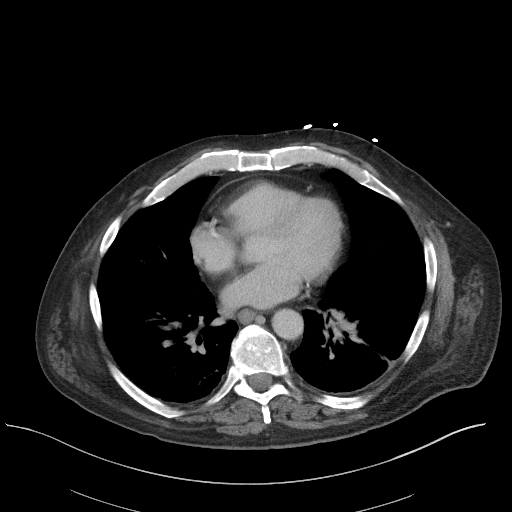

[Series 6: a/p w/ cor · coronal · 0.96mm/px · 3 of 153 slices shown]
[im 51/153  soft-tissue]
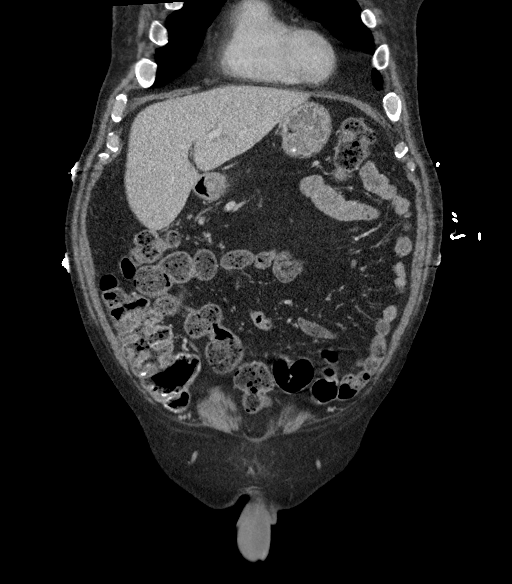
[im 68/153  soft-tissue]
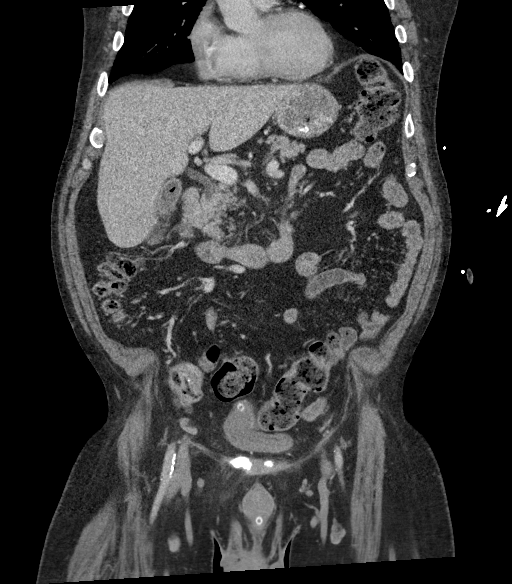
[im 85/153  soft-tissue]
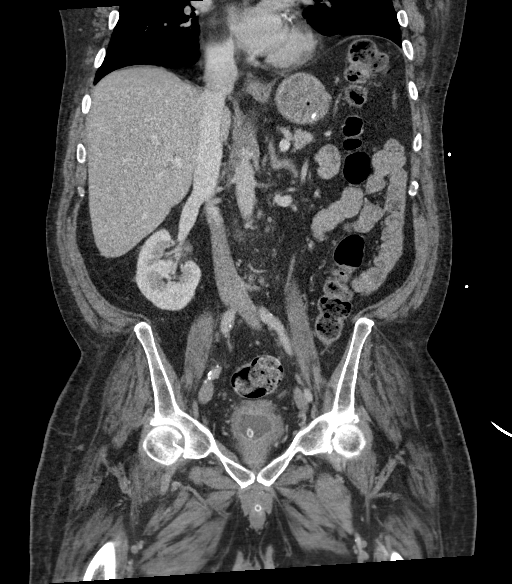

[16 of 46 positions shown; findings below may reference images not displayed]

FINDINGS: LOWER CHEST: Bilateral lower lobe atelectasis. RIGHT lower lobe
calcified granuloma with calcified RIGHT hilar lymph nodes. Included
heart size is normal. Mild coronary artery calcifications. No
pericardial effusion.

HEPATOBILIARY: Fat containing cholelithiasis. Minimal
pericholecystic fat stranding without gallbladder distension.

PANCREAS: Normal.

SPLEEN: Normal.

ADRENALS/URINARY TRACT: Kidneys are orthotopic, demonstrating
symmetric enhancement. No nephrolithiasis, hydronephrosis or solid
renal masses. The unopacified ureters are normal in course and
caliber. Delayed imaging through the kidneys demonstrates symmetric
prompt contrast excretion within the proximal urinary collecting
system. Urinary bladder decompressed by Foley catheter, retaining
bulb within the bladder. Normal adrenal glands.

STOMACH/BOWEL: The stomach, small and large bowel are normal in
course and caliber without inflammatory changes, sensitivity
decreased without oral contrast. Intraluminal gastrostomy tube.
Moderate amount of retained large bowel stool. Normal appendix.

VASCULAR/LYMPHATIC: Aortoiliac vessels are normal in course and
caliber. Mild calcific atherosclerosis. No lymphadenopathy by CT
size criteria.

REPRODUCTIVE: Normal.

OTHER: No intraperitoneal free fluid or free air. Small fat
containing LEFT inguinal hernia. Status post umbilical
herniorrhaphy.

MUSCULOSKELETAL: Nonacute. Trace anterior abdominal wall
subcutaneous gas, likely from a recent venous access. Mild
thoracolumbar dextroscoliosis may be positional.
IMPRESSION: 1. Cholelithiasis. Possible mild acute cholecystitis would be better
determined by ultrasound.
2. Moderate amount of retained large bowel stool without bowel
obstruction.

Aortic Atherosclerosis (U214S-KI6.6).

## 2018-12-08 DIAGNOSIS — Z5181 Encounter for therapeutic drug level monitoring: Secondary | ICD-10-CM | POA: Diagnosis not present

## 2018-12-15 DIAGNOSIS — E1140 Type 2 diabetes mellitus with diabetic neuropathy, unspecified: Secondary | ICD-10-CM | POA: Diagnosis not present

## 2018-12-15 DIAGNOSIS — G8929 Other chronic pain: Secondary | ICD-10-CM | POA: Diagnosis not present

## 2018-12-15 DIAGNOSIS — M545 Low back pain: Secondary | ICD-10-CM | POA: Diagnosis not present

## 2018-12-15 DIAGNOSIS — G809 Cerebral palsy, unspecified: Secondary | ICD-10-CM | POA: Diagnosis not present

## 2018-12-15 DIAGNOSIS — G8220 Paraplegia, unspecified: Secondary | ICD-10-CM | POA: Diagnosis not present

## 2018-12-15 DIAGNOSIS — F411 Generalized anxiety disorder: Secondary | ICD-10-CM | POA: Diagnosis not present

## 2019-01-07 DIAGNOSIS — E119 Type 2 diabetes mellitus without complications: Secondary | ICD-10-CM | POA: Diagnosis not present

## 2019-01-07 DIAGNOSIS — I10 Essential (primary) hypertension: Secondary | ICD-10-CM | POA: Diagnosis not present

## 2019-01-07 DIAGNOSIS — F331 Major depressive disorder, recurrent, moderate: Secondary | ICD-10-CM | POA: Diagnosis not present

## 2019-01-09 DIAGNOSIS — F411 Generalized anxiety disorder: Secondary | ICD-10-CM | POA: Diagnosis not present

## 2019-01-09 DIAGNOSIS — F331 Major depressive disorder, recurrent, moderate: Secondary | ICD-10-CM | POA: Diagnosis not present

## 2019-02-03 DIAGNOSIS — M62838 Other muscle spasm: Secondary | ICD-10-CM | POA: Diagnosis not present

## 2019-02-03 DIAGNOSIS — M6281 Muscle weakness (generalized): Secondary | ICD-10-CM | POA: Diagnosis not present

## 2019-02-03 DIAGNOSIS — G8220 Paraplegia, unspecified: Secondary | ICD-10-CM | POA: Diagnosis not present

## 2019-02-16 DIAGNOSIS — Z7409 Other reduced mobility: Secondary | ICD-10-CM | POA: Diagnosis not present

## 2019-02-16 DIAGNOSIS — G8220 Paraplegia, unspecified: Secondary | ICD-10-CM | POA: Diagnosis not present

## 2019-02-16 DIAGNOSIS — R062 Wheezing: Secondary | ICD-10-CM | POA: Diagnosis not present

## 2019-02-16 DIAGNOSIS — R5381 Other malaise: Secondary | ICD-10-CM | POA: Diagnosis not present

## 2019-02-16 DIAGNOSIS — R0989 Other specified symptoms and signs involving the circulatory and respiratory systems: Secondary | ICD-10-CM | POA: Diagnosis not present

## 2019-02-16 DIAGNOSIS — R05 Cough: Secondary | ICD-10-CM | POA: Diagnosis not present

## 2019-02-17 DIAGNOSIS — G8929 Other chronic pain: Secondary | ICD-10-CM | POA: Diagnosis not present

## 2019-02-17 DIAGNOSIS — R05 Cough: Secondary | ICD-10-CM | POA: Diagnosis not present

## 2019-02-17 DIAGNOSIS — R5381 Other malaise: Secondary | ICD-10-CM | POA: Diagnosis not present

## 2019-02-17 DIAGNOSIS — R0989 Other specified symptoms and signs involving the circulatory and respiratory systems: Secondary | ICD-10-CM | POA: Diagnosis not present

## 2019-02-17 DIAGNOSIS — R062 Wheezing: Secondary | ICD-10-CM | POA: Diagnosis not present

## 2019-02-17 DIAGNOSIS — J189 Pneumonia, unspecified organism: Secondary | ICD-10-CM | POA: Diagnosis not present

## 2019-02-18 DIAGNOSIS — Z23 Encounter for immunization: Secondary | ICD-10-CM | POA: Diagnosis not present

## 2019-03-03 DIAGNOSIS — Z20828 Contact with and (suspected) exposure to other viral communicable diseases: Secondary | ICD-10-CM | POA: Diagnosis not present

## 2019-03-09 DIAGNOSIS — Z20828 Contact with and (suspected) exposure to other viral communicable diseases: Secondary | ICD-10-CM | POA: Diagnosis not present

## 2019-03-13 DIAGNOSIS — F331 Major depressive disorder, recurrent, moderate: Secondary | ICD-10-CM | POA: Diagnosis not present

## 2019-03-13 DIAGNOSIS — F411 Generalized anxiety disorder: Secondary | ICD-10-CM | POA: Diagnosis not present

## 2019-03-18 DIAGNOSIS — Z23 Encounter for immunization: Secondary | ICD-10-CM | POA: Diagnosis not present

## 2019-03-23 DIAGNOSIS — Z20828 Contact with and (suspected) exposure to other viral communicable diseases: Secondary | ICD-10-CM | POA: Diagnosis not present

## 2019-03-27 DIAGNOSIS — M6281 Muscle weakness (generalized): Secondary | ICD-10-CM | POA: Diagnosis not present

## 2019-03-27 DIAGNOSIS — E1140 Type 2 diabetes mellitus with diabetic neuropathy, unspecified: Secondary | ICD-10-CM | POA: Diagnosis not present

## 2019-03-27 DIAGNOSIS — G8220 Paraplegia, unspecified: Secondary | ICD-10-CM | POA: Diagnosis not present

## 2019-03-27 DIAGNOSIS — R5381 Other malaise: Secondary | ICD-10-CM | POA: Diagnosis not present

## 2019-03-27 DIAGNOSIS — K5900 Constipation, unspecified: Secondary | ICD-10-CM | POA: Diagnosis not present

## 2019-03-30 DIAGNOSIS — E1140 Type 2 diabetes mellitus with diabetic neuropathy, unspecified: Secondary | ICD-10-CM | POA: Diagnosis not present

## 2019-03-30 DIAGNOSIS — K5900 Constipation, unspecified: Secondary | ICD-10-CM | POA: Diagnosis not present

## 2019-03-31 DIAGNOSIS — Z20828 Contact with and (suspected) exposure to other viral communicable diseases: Secondary | ICD-10-CM | POA: Diagnosis not present

## 2019-04-06 DIAGNOSIS — Z20828 Contact with and (suspected) exposure to other viral communicable diseases: Secondary | ICD-10-CM | POA: Diagnosis not present

## 2019-04-07 DIAGNOSIS — G809 Cerebral palsy, unspecified: Secondary | ICD-10-CM | POA: Diagnosis not present

## 2019-04-07 DIAGNOSIS — M6281 Muscle weakness (generalized): Secondary | ICD-10-CM | POA: Diagnosis not present

## 2019-04-07 DIAGNOSIS — G8220 Paraplegia, unspecified: Secondary | ICD-10-CM | POA: Diagnosis not present

## 2019-04-08 DIAGNOSIS — M6281 Muscle weakness (generalized): Secondary | ICD-10-CM | POA: Diagnosis not present

## 2019-04-08 DIAGNOSIS — G8220 Paraplegia, unspecified: Secondary | ICD-10-CM | POA: Diagnosis not present

## 2019-04-08 DIAGNOSIS — G809 Cerebral palsy, unspecified: Secondary | ICD-10-CM | POA: Diagnosis not present

## 2019-04-09 DIAGNOSIS — G8220 Paraplegia, unspecified: Secondary | ICD-10-CM | POA: Diagnosis not present

## 2019-04-09 DIAGNOSIS — G809 Cerebral palsy, unspecified: Secondary | ICD-10-CM | POA: Diagnosis not present

## 2019-04-09 DIAGNOSIS — M6281 Muscle weakness (generalized): Secondary | ICD-10-CM | POA: Diagnosis not present

## 2019-04-10 DIAGNOSIS — G8220 Paraplegia, unspecified: Secondary | ICD-10-CM | POA: Diagnosis not present

## 2019-04-10 DIAGNOSIS — F331 Major depressive disorder, recurrent, moderate: Secondary | ICD-10-CM | POA: Diagnosis not present

## 2019-04-10 DIAGNOSIS — M6281 Muscle weakness (generalized): Secondary | ICD-10-CM | POA: Diagnosis not present

## 2019-04-10 DIAGNOSIS — G809 Cerebral palsy, unspecified: Secondary | ICD-10-CM | POA: Diagnosis not present

## 2019-04-10 DIAGNOSIS — G894 Chronic pain syndrome: Secondary | ICD-10-CM | POA: Diagnosis not present

## 2019-04-10 DIAGNOSIS — F411 Generalized anxiety disorder: Secondary | ICD-10-CM | POA: Diagnosis not present

## 2019-04-14 DIAGNOSIS — M6281 Muscle weakness (generalized): Secondary | ICD-10-CM | POA: Diagnosis not present

## 2019-04-14 DIAGNOSIS — G8220 Paraplegia, unspecified: Secondary | ICD-10-CM | POA: Diagnosis not present

## 2019-04-14 DIAGNOSIS — G809 Cerebral palsy, unspecified: Secondary | ICD-10-CM | POA: Diagnosis not present

## 2019-04-15 DIAGNOSIS — G8220 Paraplegia, unspecified: Secondary | ICD-10-CM | POA: Diagnosis not present

## 2019-04-15 DIAGNOSIS — M6281 Muscle weakness (generalized): Secondary | ICD-10-CM | POA: Diagnosis not present

## 2019-04-15 DIAGNOSIS — G809 Cerebral palsy, unspecified: Secondary | ICD-10-CM | POA: Diagnosis not present

## 2019-04-16 DIAGNOSIS — G8220 Paraplegia, unspecified: Secondary | ICD-10-CM | POA: Diagnosis not present

## 2019-04-16 DIAGNOSIS — M6281 Muscle weakness (generalized): Secondary | ICD-10-CM | POA: Diagnosis not present

## 2019-04-16 DIAGNOSIS — G809 Cerebral palsy, unspecified: Secondary | ICD-10-CM | POA: Diagnosis not present

## 2019-04-17 DIAGNOSIS — G809 Cerebral palsy, unspecified: Secondary | ICD-10-CM | POA: Diagnosis not present

## 2019-04-17 DIAGNOSIS — G8220 Paraplegia, unspecified: Secondary | ICD-10-CM | POA: Diagnosis not present

## 2019-04-17 DIAGNOSIS — M6281 Muscle weakness (generalized): Secondary | ICD-10-CM | POA: Diagnosis not present

## 2019-04-18 DIAGNOSIS — G809 Cerebral palsy, unspecified: Secondary | ICD-10-CM | POA: Diagnosis not present

## 2019-04-18 DIAGNOSIS — M6281 Muscle weakness (generalized): Secondary | ICD-10-CM | POA: Diagnosis not present

## 2019-04-18 DIAGNOSIS — G8220 Paraplegia, unspecified: Secondary | ICD-10-CM | POA: Diagnosis not present

## 2019-04-19 DIAGNOSIS — G809 Cerebral palsy, unspecified: Secondary | ICD-10-CM | POA: Diagnosis not present

## 2019-04-19 DIAGNOSIS — G8220 Paraplegia, unspecified: Secondary | ICD-10-CM | POA: Diagnosis not present

## 2019-04-19 DIAGNOSIS — M6281 Muscle weakness (generalized): Secondary | ICD-10-CM | POA: Diagnosis not present

## 2019-04-21 DIAGNOSIS — G8220 Paraplegia, unspecified: Secondary | ICD-10-CM | POA: Diagnosis not present

## 2019-04-21 DIAGNOSIS — M6281 Muscle weakness (generalized): Secondary | ICD-10-CM | POA: Diagnosis not present

## 2019-04-21 DIAGNOSIS — G809 Cerebral palsy, unspecified: Secondary | ICD-10-CM | POA: Diagnosis not present

## 2019-04-22 DIAGNOSIS — G8220 Paraplegia, unspecified: Secondary | ICD-10-CM | POA: Diagnosis not present

## 2019-04-22 DIAGNOSIS — M6281 Muscle weakness (generalized): Secondary | ICD-10-CM | POA: Diagnosis not present

## 2019-04-22 DIAGNOSIS — G809 Cerebral palsy, unspecified: Secondary | ICD-10-CM | POA: Diagnosis not present

## 2019-04-23 DIAGNOSIS — G809 Cerebral palsy, unspecified: Secondary | ICD-10-CM | POA: Diagnosis not present

## 2019-04-23 DIAGNOSIS — G8220 Paraplegia, unspecified: Secondary | ICD-10-CM | POA: Diagnosis not present

## 2019-04-23 DIAGNOSIS — M6281 Muscle weakness (generalized): Secondary | ICD-10-CM | POA: Diagnosis not present

## 2019-04-24 DIAGNOSIS — G809 Cerebral palsy, unspecified: Secondary | ICD-10-CM | POA: Diagnosis not present

## 2019-04-24 DIAGNOSIS — M6281 Muscle weakness (generalized): Secondary | ICD-10-CM | POA: Diagnosis not present

## 2019-04-24 DIAGNOSIS — G8220 Paraplegia, unspecified: Secondary | ICD-10-CM | POA: Diagnosis not present

## 2019-04-27 DIAGNOSIS — M6281 Muscle weakness (generalized): Secondary | ICD-10-CM | POA: Diagnosis not present

## 2019-04-27 DIAGNOSIS — G809 Cerebral palsy, unspecified: Secondary | ICD-10-CM | POA: Diagnosis not present

## 2019-04-27 DIAGNOSIS — G8220 Paraplegia, unspecified: Secondary | ICD-10-CM | POA: Diagnosis not present

## 2019-04-27 IMAGING — US US ABDOMEN LIMITED
1 series · 14 of 14 positions shown · non-contrast
Comparison: 11/26/2017

CLINICAL DATA: Gallstones seen on CT

EXAM:
ULTRASOUND ABDOMEN LIMITED RIGHT UPPER QUADRANT

[Series 1: us abdomen limited · 0.31mm/px · 14 of 14 slices shown]
[im 1/14]
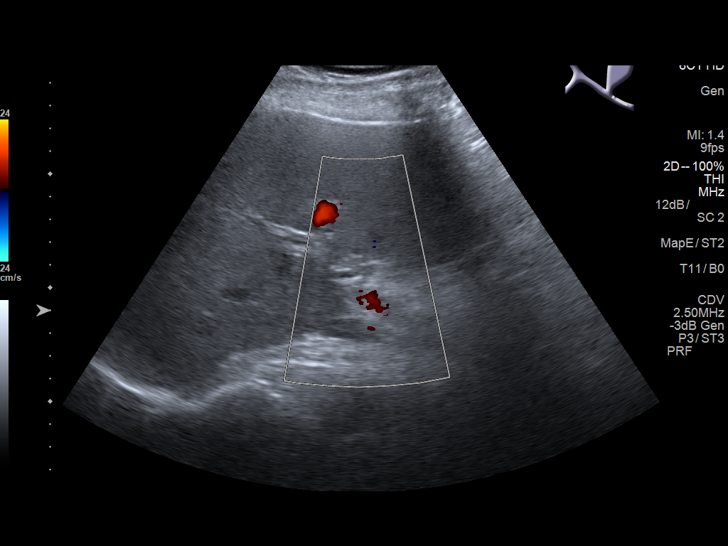
[im 2/14]
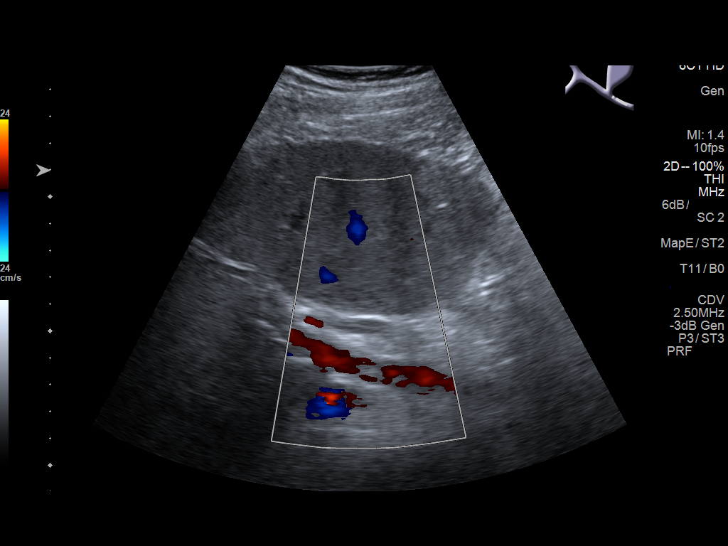
[im 3/14]
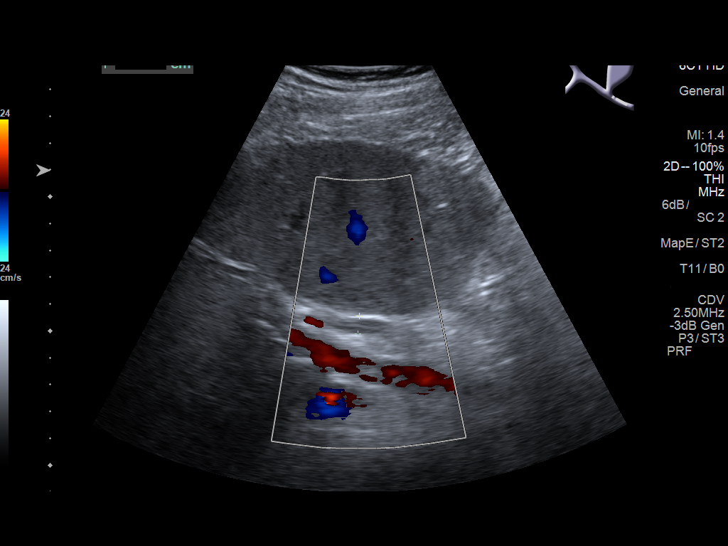
[im 4/14]
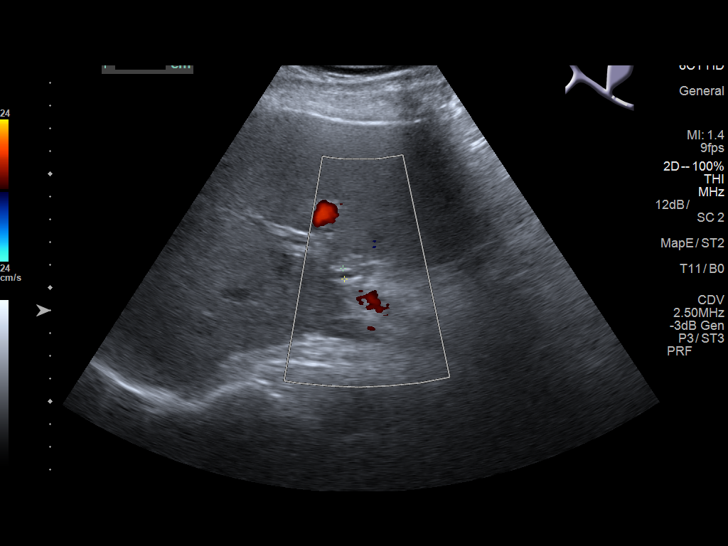
[im 5/14]
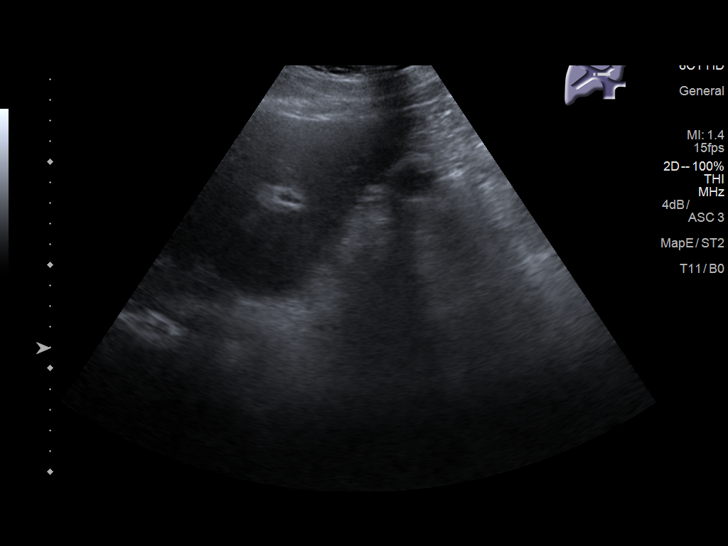
[im 6/14]
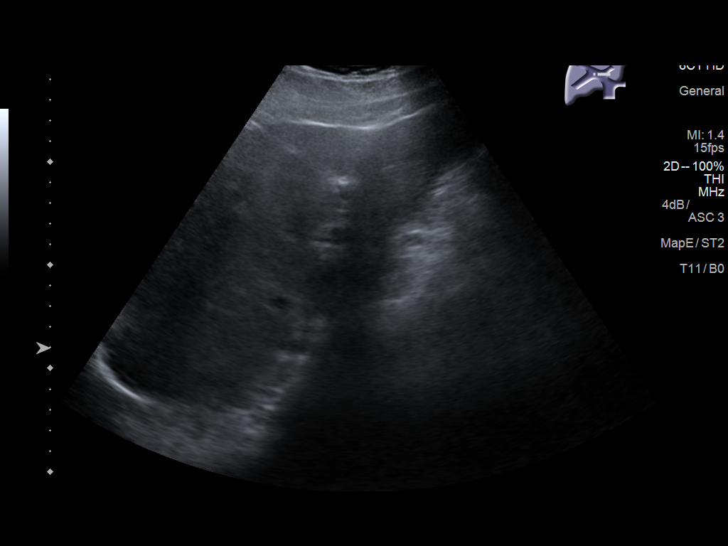
[im 7/14]
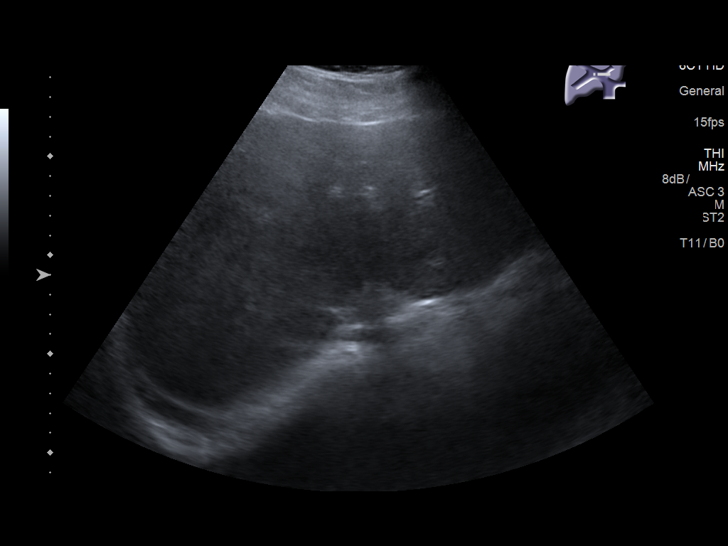
[im 8/14]
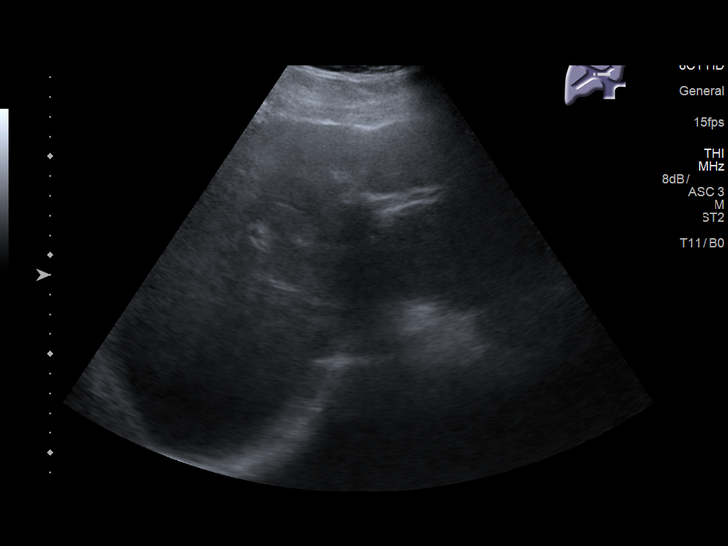
[im 9/14]
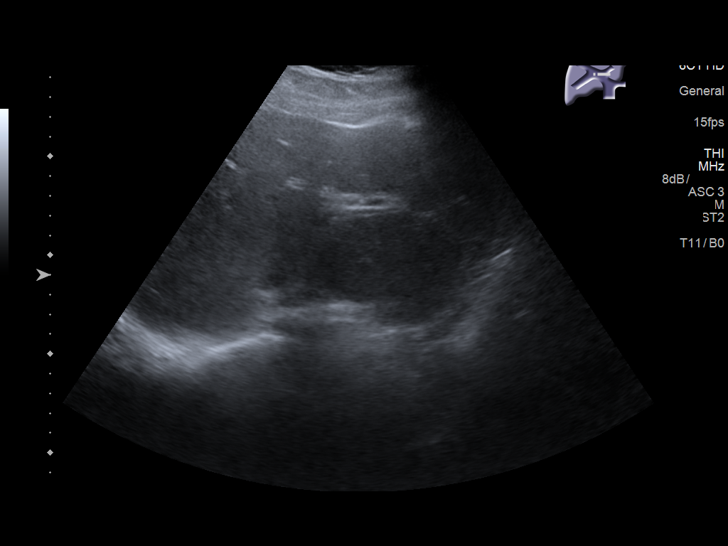
[im 10/14]
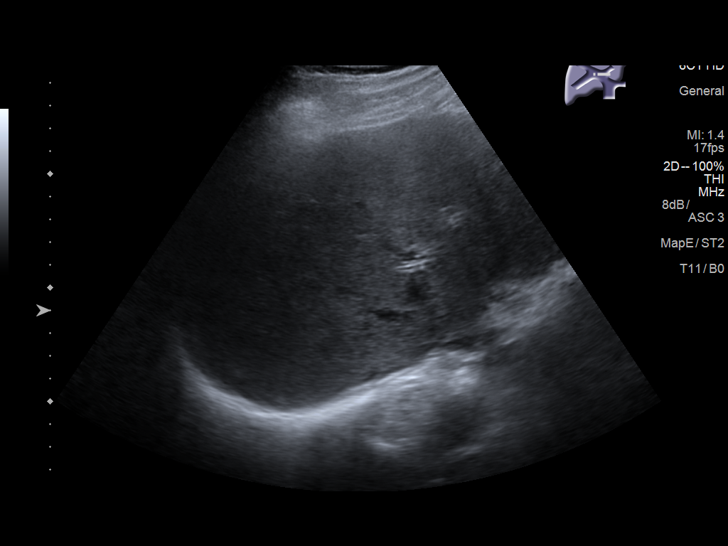
[im 11/14]
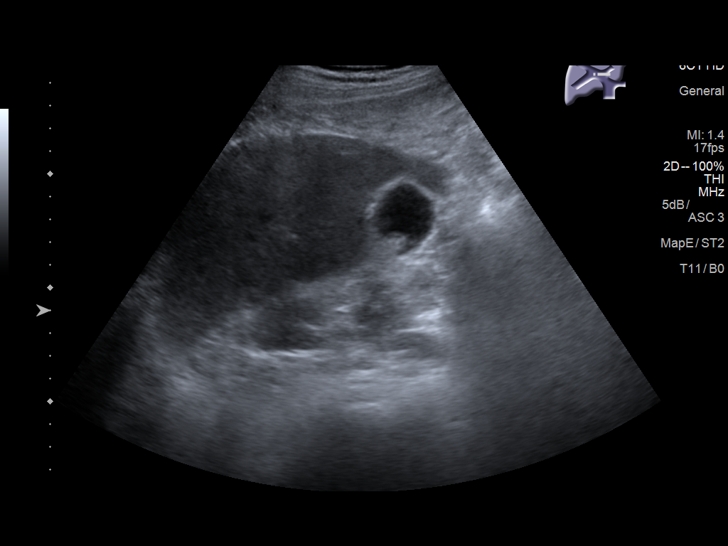
[im 12/14]
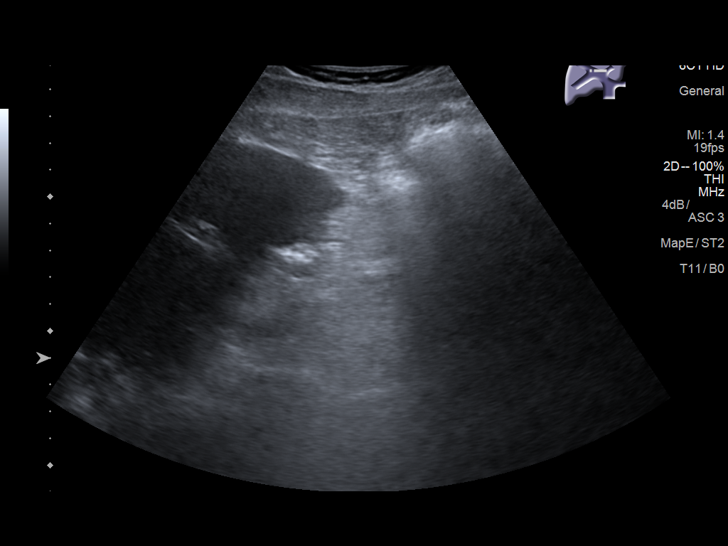
[im 13/14]
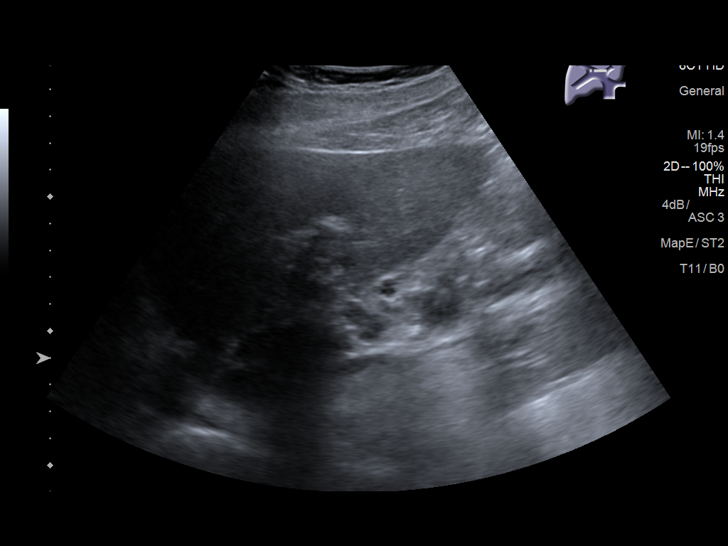
[im 14/14]
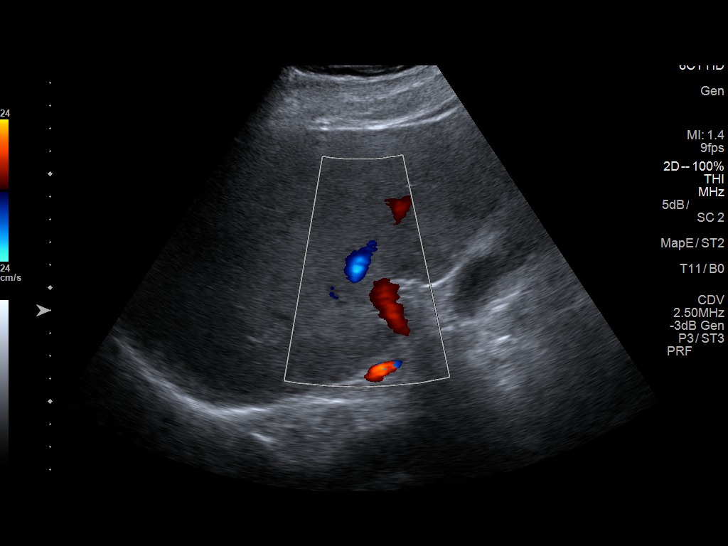

[14 of 14 positions shown; findings below may reference images not displayed]

FINDINGS: Gallbladder:

Several gallstones noted within the gallbladder which are mobile,
the largest measuring 18 mm. No wall thickening or sonographic
Murphy's sign.

Common bile duct:

Diameter: Upper limits normal in diameter, 7 mm.

Liver:

No focal lesion identified. Within normal limits in parenchymal
echogenicity. Portal vein is patent on color Doppler imaging with
normal direction of blood flow towards the liver.
IMPRESSION: Cholelithiasis.  No sonographic evidence of acute cholecystitis.

## 2019-04-30 DIAGNOSIS — G4089 Other seizures: Secondary | ICD-10-CM | POA: Diagnosis not present

## 2019-04-30 DIAGNOSIS — F331 Major depressive disorder, recurrent, moderate: Secondary | ICD-10-CM | POA: Diagnosis not present

## 2019-04-30 DIAGNOSIS — G894 Chronic pain syndrome: Secondary | ICD-10-CM | POA: Diagnosis not present

## 2019-04-30 DIAGNOSIS — Z6834 Body mass index (BMI) 34.0-34.9, adult: Secondary | ICD-10-CM | POA: Diagnosis not present

## 2019-04-30 DIAGNOSIS — G809 Cerebral palsy, unspecified: Secondary | ICD-10-CM | POA: Diagnosis not present

## 2019-04-30 DIAGNOSIS — F411 Generalized anxiety disorder: Secondary | ICD-10-CM | POA: Diagnosis not present

## 2019-04-30 DIAGNOSIS — E1140 Type 2 diabetes mellitus with diabetic neuropathy, unspecified: Secondary | ICD-10-CM | POA: Diagnosis not present

## 2019-04-30 DIAGNOSIS — L409 Psoriasis, unspecified: Secondary | ICD-10-CM | POA: Diagnosis not present

## 2019-04-30 DIAGNOSIS — I10 Essential (primary) hypertension: Secondary | ICD-10-CM | POA: Diagnosis not present

## 2019-04-30 DIAGNOSIS — R339 Retention of urine, unspecified: Secondary | ICD-10-CM | POA: Diagnosis not present

## 2019-04-30 DIAGNOSIS — G8220 Paraplegia, unspecified: Secondary | ICD-10-CM | POA: Diagnosis not present

## 2019-05-01 DIAGNOSIS — E559 Vitamin D deficiency, unspecified: Secondary | ICD-10-CM | POA: Diagnosis not present

## 2019-05-01 DIAGNOSIS — E119 Type 2 diabetes mellitus without complications: Secondary | ICD-10-CM | POA: Diagnosis not present

## 2019-05-01 DIAGNOSIS — D649 Anemia, unspecified: Secondary | ICD-10-CM | POA: Diagnosis not present

## 2019-05-06 DIAGNOSIS — R0602 Shortness of breath: Secondary | ICD-10-CM | POA: Diagnosis not present

## 2019-05-06 DIAGNOSIS — R0989 Other specified symptoms and signs involving the circulatory and respiratory systems: Secondary | ICD-10-CM | POA: Diagnosis not present

## 2019-05-06 DIAGNOSIS — G4700 Insomnia, unspecified: Secondary | ICD-10-CM | POA: Diagnosis not present

## 2019-05-06 DIAGNOSIS — E1140 Type 2 diabetes mellitus with diabetic neuropathy, unspecified: Secondary | ICD-10-CM | POA: Diagnosis not present

## 2019-05-07 DIAGNOSIS — F3341 Major depressive disorder, recurrent, in partial remission: Secondary | ICD-10-CM | POA: Diagnosis not present

## 2019-05-07 DIAGNOSIS — F411 Generalized anxiety disorder: Secondary | ICD-10-CM | POA: Diagnosis not present

## 2019-05-07 DIAGNOSIS — F331 Major depressive disorder, recurrent, moderate: Secondary | ICD-10-CM | POA: Diagnosis not present

## 2019-05-12 DIAGNOSIS — Z20828 Contact with and (suspected) exposure to other viral communicable diseases: Secondary | ICD-10-CM | POA: Diagnosis not present

## 2019-05-18 DIAGNOSIS — L03312 Cellulitis of back [any part except buttock]: Secondary | ICD-10-CM | POA: Diagnosis not present

## 2019-05-18 DIAGNOSIS — R52 Pain, unspecified: Secondary | ICD-10-CM | POA: Diagnosis not present

## 2019-05-18 DIAGNOSIS — E1140 Type 2 diabetes mellitus with diabetic neuropathy, unspecified: Secondary | ICD-10-CM | POA: Diagnosis not present

## 2019-05-19 DIAGNOSIS — Z20828 Contact with and (suspected) exposure to other viral communicable diseases: Secondary | ICD-10-CM | POA: Diagnosis not present

## 2019-05-20 DIAGNOSIS — L03312 Cellulitis of back [any part except buttock]: Secondary | ICD-10-CM | POA: Diagnosis not present

## 2019-05-20 DIAGNOSIS — E1140 Type 2 diabetes mellitus with diabetic neuropathy, unspecified: Secondary | ICD-10-CM | POA: Diagnosis not present

## 2019-05-20 DIAGNOSIS — R52 Pain, unspecified: Secondary | ICD-10-CM | POA: Diagnosis not present

## 2019-05-28 DIAGNOSIS — F3341 Major depressive disorder, recurrent, in partial remission: Secondary | ICD-10-CM | POA: Diagnosis not present

## 2019-05-28 DIAGNOSIS — F411 Generalized anxiety disorder: Secondary | ICD-10-CM | POA: Diagnosis not present

## 2019-05-29 DIAGNOSIS — F411 Generalized anxiety disorder: Secondary | ICD-10-CM | POA: Diagnosis not present

## 2019-05-29 DIAGNOSIS — F331 Major depressive disorder, recurrent, moderate: Secondary | ICD-10-CM | POA: Diagnosis not present

## 2019-06-05 DIAGNOSIS — L03312 Cellulitis of back [any part except buttock]: Secondary | ICD-10-CM | POA: Diagnosis not present

## 2019-06-05 DIAGNOSIS — R52 Pain, unspecified: Secondary | ICD-10-CM | POA: Diagnosis not present

## 2019-06-16 DIAGNOSIS — R0989 Other specified symptoms and signs involving the circulatory and respiratory systems: Secondary | ICD-10-CM | POA: Diagnosis not present

## 2019-06-16 DIAGNOSIS — R062 Wheezing: Secondary | ICD-10-CM | POA: Diagnosis not present

## 2019-06-16 DIAGNOSIS — K567 Ileus, unspecified: Secondary | ICD-10-CM | POA: Diagnosis not present

## 2019-06-16 DIAGNOSIS — R1084 Generalized abdominal pain: Secondary | ICD-10-CM | POA: Diagnosis not present

## 2019-06-16 DIAGNOSIS — R112 Nausea with vomiting, unspecified: Secondary | ICD-10-CM | POA: Diagnosis not present

## 2019-06-16 DIAGNOSIS — R197 Diarrhea, unspecified: Secondary | ICD-10-CM | POA: Diagnosis not present

## 2019-06-16 DIAGNOSIS — K5900 Constipation, unspecified: Secondary | ICD-10-CM | POA: Diagnosis not present

## 2019-06-18 DIAGNOSIS — K5900 Constipation, unspecified: Secondary | ICD-10-CM | POA: Diagnosis not present

## 2019-06-18 DIAGNOSIS — R112 Nausea with vomiting, unspecified: Secondary | ICD-10-CM | POA: Diagnosis not present

## 2019-06-18 DIAGNOSIS — K567 Ileus, unspecified: Secondary | ICD-10-CM | POA: Diagnosis not present

## 2019-06-26 DIAGNOSIS — F331 Major depressive disorder, recurrent, moderate: Secondary | ICD-10-CM | POA: Diagnosis not present

## 2019-06-26 DIAGNOSIS — F411 Generalized anxiety disorder: Secondary | ICD-10-CM | POA: Diagnosis not present

## 2019-10-07 DIAGNOSIS — K567 Ileus, unspecified: Secondary | ICD-10-CM | POA: Diagnosis not present

## 2019-10-08 DIAGNOSIS — J9611 Chronic respiratory failure with hypoxia: Secondary | ICD-10-CM | POA: Diagnosis not present

## 2019-10-08 DIAGNOSIS — E11621 Type 2 diabetes mellitus with foot ulcer: Secondary | ICD-10-CM | POA: Diagnosis not present

## 2019-10-08 DIAGNOSIS — I10 Essential (primary) hypertension: Secondary | ICD-10-CM | POA: Diagnosis not present

## 2019-10-08 DIAGNOSIS — E1142 Type 2 diabetes mellitus with diabetic polyneuropathy: Secondary | ICD-10-CM | POA: Diagnosis not present

## 2019-10-10 DIAGNOSIS — K567 Ileus, unspecified: Secondary | ICD-10-CM | POA: Diagnosis not present

## 2019-10-16 DIAGNOSIS — F331 Major depressive disorder, recurrent, moderate: Secondary | ICD-10-CM | POA: Diagnosis not present

## 2019-10-16 DIAGNOSIS — F411 Generalized anxiety disorder: Secondary | ICD-10-CM | POA: Diagnosis not present

## 2019-10-26 DIAGNOSIS — N390 Urinary tract infection, site not specified: Secondary | ICD-10-CM | POA: Diagnosis not present

## 2019-10-28 ENCOUNTER — Encounter (HOSPITAL_COMMUNITY): Payer: Self-pay | Admitting: Internal Medicine

## 2019-10-28 ENCOUNTER — Inpatient Hospital Stay (HOSPITAL_COMMUNITY)
Admission: EM | Admit: 2019-10-28 | Discharge: 2019-11-05 | DRG: 871 | Disposition: A | Discharge status: Skilled Nursing Facility | Payer: Medicare Other | Source: Skilled Nursing Facility | Attending: Internal Medicine | Admitting: Internal Medicine

## 2019-10-28 ENCOUNTER — Emergency Department (HOSPITAL_COMMUNITY): Payer: Medicare Other

## 2019-10-28 DIAGNOSIS — F418 Other specified anxiety disorders: Secondary | ICD-10-CM | POA: Diagnosis present

## 2019-10-28 DIAGNOSIS — Z6835 Body mass index (BMI) 35.0-35.9, adult: Secondary | ICD-10-CM | POA: Diagnosis not present

## 2019-10-28 DIAGNOSIS — Z7401 Bed confinement status: Secondary | ICD-10-CM | POA: Diagnosis not present

## 2019-10-28 DIAGNOSIS — R0689 Other abnormalities of breathing: Secondary | ICD-10-CM

## 2019-10-28 DIAGNOSIS — J9622 Acute and chronic respiratory failure with hypercapnia: Secondary | ICD-10-CM | POA: Diagnosis present

## 2019-10-28 DIAGNOSIS — Z87891 Personal history of nicotine dependence: Secondary | ICD-10-CM

## 2019-10-28 DIAGNOSIS — R5381 Other malaise: Secondary | ICD-10-CM | POA: Diagnosis not present

## 2019-10-28 DIAGNOSIS — G40909 Epilepsy, unspecified, not intractable, without status epilepticus: Secondary | ICD-10-CM | POA: Diagnosis present

## 2019-10-28 DIAGNOSIS — Z1624 Resistance to multiple antibiotics: Secondary | ICD-10-CM | POA: Diagnosis present

## 2019-10-28 DIAGNOSIS — N139 Obstructive and reflux uropathy, unspecified: Secondary | ICD-10-CM | POA: Diagnosis not present

## 2019-10-28 DIAGNOSIS — Z87440 Personal history of urinary (tract) infections: Secondary | ICD-10-CM | POA: Diagnosis not present

## 2019-10-28 DIAGNOSIS — Y95 Nosocomial condition: Secondary | ICD-10-CM | POA: Diagnosis present

## 2019-10-28 DIAGNOSIS — R0902 Hypoxemia: Secondary | ICD-10-CM | POA: Diagnosis not present

## 2019-10-28 DIAGNOSIS — G808 Other cerebral palsy: Secondary | ICD-10-CM | POA: Diagnosis present

## 2019-10-28 DIAGNOSIS — Z993 Dependence on wheelchair: Secondary | ICD-10-CM | POA: Diagnosis not present

## 2019-10-28 DIAGNOSIS — F411 Generalized anxiety disorder: Secondary | ICD-10-CM | POA: Diagnosis not present

## 2019-10-28 DIAGNOSIS — J9621 Acute and chronic respiratory failure with hypoxia: Secondary | ICD-10-CM | POA: Diagnosis present

## 2019-10-28 DIAGNOSIS — E1140 Type 2 diabetes mellitus with diabetic neuropathy, unspecified: Secondary | ICD-10-CM | POA: Diagnosis not present

## 2019-10-28 DIAGNOSIS — E872 Acidosis, unspecified: Secondary | ICD-10-CM

## 2019-10-28 DIAGNOSIS — D519 Vitamin B12 deficiency anemia, unspecified: Secondary | ICD-10-CM | POA: Diagnosis not present

## 2019-10-28 DIAGNOSIS — R062 Wheezing: Secondary | ICD-10-CM | POA: Diagnosis not present

## 2019-10-28 DIAGNOSIS — E1169 Type 2 diabetes mellitus with other specified complication: Secondary | ICD-10-CM | POA: Diagnosis present

## 2019-10-28 DIAGNOSIS — E119 Type 2 diabetes mellitus without complications: Secondary | ICD-10-CM | POA: Diagnosis present

## 2019-10-28 DIAGNOSIS — F419 Anxiety disorder, unspecified: Secondary | ICD-10-CM | POA: Diagnosis not present

## 2019-10-28 DIAGNOSIS — K219 Gastro-esophageal reflux disease without esophagitis: Secondary | ICD-10-CM | POA: Diagnosis not present

## 2019-10-28 DIAGNOSIS — I10 Essential (primary) hypertension: Secondary | ICD-10-CM | POA: Diagnosis present

## 2019-10-28 DIAGNOSIS — G894 Chronic pain syndrome: Secondary | ICD-10-CM | POA: Diagnosis present

## 2019-10-28 DIAGNOSIS — Z930 Tracheostomy status: Secondary | ICD-10-CM

## 2019-10-28 DIAGNOSIS — Z66 Do not resuscitate: Secondary | ICD-10-CM | POA: Diagnosis present

## 2019-10-28 DIAGNOSIS — E1165 Type 2 diabetes mellitus with hyperglycemia: Secondary | ICD-10-CM | POA: Diagnosis not present

## 2019-10-28 DIAGNOSIS — E669 Obesity, unspecified: Secondary | ICD-10-CM | POA: Diagnosis present

## 2019-10-28 DIAGNOSIS — R4182 Altered mental status, unspecified: Secondary | ICD-10-CM | POA: Diagnosis not present

## 2019-10-28 DIAGNOSIS — I517 Cardiomegaly: Secondary | ICD-10-CM | POA: Diagnosis not present

## 2019-10-28 DIAGNOSIS — L209 Atopic dermatitis, unspecified: Secondary | ICD-10-CM | POA: Diagnosis not present

## 2019-10-28 DIAGNOSIS — R61 Generalized hyperhidrosis: Secondary | ICD-10-CM | POA: Diagnosis not present

## 2019-10-28 DIAGNOSIS — I2510 Atherosclerotic heart disease of native coronary artery without angina pectoris: Secondary | ICD-10-CM | POA: Diagnosis not present

## 2019-10-28 DIAGNOSIS — Z9119 Patient's noncompliance with other medical treatment and regimen: Secondary | ICD-10-CM | POA: Diagnosis not present

## 2019-10-28 DIAGNOSIS — Z20822 Contact with and (suspected) exposure to covid-19: Secondary | ICD-10-CM | POA: Diagnosis present

## 2019-10-28 DIAGNOSIS — G809 Cerebral palsy, unspecified: Secondary | ICD-10-CM | POA: Diagnosis not present

## 2019-10-28 DIAGNOSIS — J189 Pneumonia, unspecified organism: Secondary | ICD-10-CM | POA: Diagnosis present

## 2019-10-28 DIAGNOSIS — G8929 Other chronic pain: Secondary | ICD-10-CM | POA: Diagnosis not present

## 2019-10-28 DIAGNOSIS — A419 Sepsis, unspecified organism: Secondary | ICD-10-CM | POA: Diagnosis present

## 2019-10-28 DIAGNOSIS — N179 Acute kidney failure, unspecified: Secondary | ICD-10-CM | POA: Diagnosis present

## 2019-10-28 DIAGNOSIS — J181 Lobar pneumonia, unspecified organism: Secondary | ICD-10-CM | POA: Diagnosis not present

## 2019-10-28 DIAGNOSIS — M2550 Pain in unspecified joint: Secondary | ICD-10-CM | POA: Diagnosis not present

## 2019-10-28 DIAGNOSIS — M62838 Other muscle spasm: Secondary | ICD-10-CM | POA: Diagnosis not present

## 2019-10-28 DIAGNOSIS — R0602 Shortness of breath: Secondary | ICD-10-CM | POA: Diagnosis not present

## 2019-10-28 DIAGNOSIS — Z139 Encounter for screening, unspecified: Secondary | ICD-10-CM | POA: Diagnosis not present

## 2019-10-28 DIAGNOSIS — M6281 Muscle weakness (generalized): Secondary | ICD-10-CM | POA: Diagnosis not present

## 2019-10-28 DIAGNOSIS — F331 Major depressive disorder, recurrent, moderate: Secondary | ICD-10-CM | POA: Diagnosis not present

## 2019-10-28 DIAGNOSIS — J80 Acute respiratory distress syndrome: Secondary | ICD-10-CM | POA: Diagnosis not present

## 2019-10-28 DIAGNOSIS — G8220 Paraplegia, unspecified: Secondary | ICD-10-CM | POA: Diagnosis not present

## 2019-10-28 DIAGNOSIS — R0600 Dyspnea, unspecified: Secondary | ICD-10-CM | POA: Diagnosis not present

## 2019-10-28 DIAGNOSIS — E1142 Type 2 diabetes mellitus with diabetic polyneuropathy: Secondary | ICD-10-CM | POA: Diagnosis not present

## 2019-10-28 DIAGNOSIS — G4089 Other seizures: Secondary | ICD-10-CM | POA: Diagnosis not present

## 2019-10-28 DIAGNOSIS — R293 Abnormal posture: Secondary | ICD-10-CM | POA: Diagnosis not present

## 2019-10-28 DIAGNOSIS — Z6834 Body mass index (BMI) 34.0-34.9, adult: Secondary | ICD-10-CM

## 2019-10-28 DIAGNOSIS — L22 Diaper dermatitis: Secondary | ICD-10-CM | POA: Diagnosis not present

## 2019-10-28 DIAGNOSIS — Z6836 Body mass index (BMI) 36.0-36.9, adult: Secondary | ICD-10-CM

## 2019-10-28 DIAGNOSIS — R5383 Other fatigue: Secondary | ICD-10-CM | POA: Diagnosis not present

## 2019-10-28 DIAGNOSIS — E6609 Other obesity due to excess calories: Secondary | ICD-10-CM | POA: Diagnosis not present

## 2019-10-28 DIAGNOSIS — R0989 Other specified symptoms and signs involving the circulatory and respiratory systems: Secondary | ICD-10-CM | POA: Diagnosis not present

## 2019-10-28 DIAGNOSIS — G9341 Metabolic encephalopathy: Secondary | ICD-10-CM | POA: Diagnosis present

## 2019-10-28 DIAGNOSIS — E559 Vitamin D deficiency, unspecified: Secondary | ICD-10-CM | POA: Diagnosis not present

## 2019-10-28 DIAGNOSIS — J439 Emphysema, unspecified: Secondary | ICD-10-CM | POA: Diagnosis not present

## 2019-10-28 DIAGNOSIS — J90 Pleural effusion, not elsewhere classified: Secondary | ICD-10-CM | POA: Diagnosis not present

## 2019-10-28 DIAGNOSIS — N390 Urinary tract infection, site not specified: Secondary | ICD-10-CM | POA: Diagnosis not present

## 2019-10-28 HISTORY — DX: Chronic pain syndrome: G89.4

## 2019-10-28 HISTORY — DX: Obesity, unspecified: E66.9

## 2019-10-28 HISTORY — DX: Body mass index (BMI) 34.0-34.9, adult: Z68.34

## 2019-10-28 HISTORY — DX: Other cerebral palsy: G80.8

## 2019-10-28 HISTORY — DX: Essential (primary) hypertension: I10

## 2019-10-28 HISTORY — DX: Obesity, class 1: E66.811

## 2019-10-28 HISTORY — DX: Epilepsy, unspecified, not intractable, without status epilepticus: G40.909

## 2019-10-28 HISTORY — DX: Other specified anxiety disorders: F41.8

## 2019-10-28 HISTORY — DX: Do not resuscitate: Z66

## 2019-10-28 LAB — COMPREHENSIVE METABOLIC PANEL
ALT: 16 U/L (ref 0–44)
AST: 28 U/L (ref 15–41)
Albumin: 3.1 g/dL — ABNORMAL LOW (ref 3.5–5.0)
Alkaline Phosphatase: 103 U/L (ref 38–126)
Anion gap: 12 (ref 5–15)
BUN: 17 mg/dL (ref 8–23)
CO2: 28 mmol/L (ref 22–32)
Calcium: 8.3 mg/dL — ABNORMAL LOW (ref 8.9–10.3)
Chloride: 95 mmol/L — ABNORMAL LOW (ref 98–111)
Creatinine, Ser: 1.34 mg/dL — ABNORMAL HIGH (ref 0.61–1.24)
GFR calc Af Amer: >60 mL/min (ref >60)
GFR calc non Af Amer: 56 mL/min — ABNORMAL LOW (ref >60)
Glucose, Bld: 315 mg/dL — ABNORMAL HIGH (ref 70–99)
Potassium: 4.2 mmol/L (ref 3.5–5.1)
Sodium: 135 mmol/L (ref 135–145)
Total Bilirubin: 1.3 mg/dL — ABNORMAL HIGH (ref 0.3–1.2)
Total Protein: 8 g/dL (ref 6.5–8.1)

## 2019-10-28 LAB — CBC WITH DIFFERENTIAL/PLATELET
Abs Immature Granulocytes: 0.1 10*3/uL — ABNORMAL HIGH (ref 0.00–0.07)
Basophils Absolute: 0.1 10*3/uL (ref 0.0–0.1)
Basophils Relative: 0 %
Eosinophils Absolute: 0 10*3/uL (ref 0.0–0.5)
Eosinophils Relative: 0 %
HCT: 58.7 % — ABNORMAL HIGH (ref 39.0–52.0)
Hemoglobin: 17.6 g/dL — ABNORMAL HIGH (ref 13.0–17.0)
Immature Granulocytes: 1 %
Lymphocytes Relative: 7 %
Lymphs Abs: 1 10*3/uL (ref 0.7–4.0)
MCH: 28 pg (ref 26.0–34.0)
MCHC: 30 g/dL (ref 30.0–36.0)
MCV: 93.5 fL (ref 80.0–100.0)
Monocytes Absolute: 1.1 10*3/uL — ABNORMAL HIGH (ref 0.1–1.0)
Monocytes Relative: 7 %
Neutro Abs: 12.5 10*3/uL — ABNORMAL HIGH (ref 1.7–7.7)
Neutrophils Relative %: 85 %
Platelets: 187 10*3/uL (ref 150–400)
RBC: 6.28 MIL/uL — ABNORMAL HIGH (ref 4.22–5.81)
RDW: 13.3 % (ref 11.5–15.5)
WBC: 14.6 10*3/uL — ABNORMAL HIGH (ref 4.0–10.5)
nRBC: 0 % (ref 0.0–0.2)

## 2019-10-28 LAB — I-STAT VENOUS BLOOD GAS, ED
Acid-Base Excess: 5 mmol/L — ABNORMAL HIGH (ref 0.0–2.0)
Bicarbonate: 34.3 mmol/L — ABNORMAL HIGH (ref 20.0–28.0)
Calcium, Ion: 1.06 mmol/L — ABNORMAL LOW (ref 1.15–1.40)
HCT: 60 % — ABNORMAL HIGH (ref 39.0–52.0)
Hemoglobin: 20.4 g/dL — ABNORMAL HIGH (ref 13.0–17.0)
O2 Saturation: 95 %
Potassium: 4.2 mmol/L (ref 3.5–5.1)
Sodium: 138 mmol/L (ref 135–145)
TCO2: 36 mmol/L — ABNORMAL HIGH (ref 22–32)
pCO2, Ven: 66.9 mmHg — ABNORMAL HIGH (ref 44.0–60.0)
pH, Ven: 7.318 (ref 7.250–7.430)
pO2, Ven: 85 mmHg — ABNORMAL HIGH (ref 32.0–45.0)

## 2019-10-28 LAB — I-STAT ARTERIAL BLOOD GAS, ED
Acid-Base Excess: 5 mmol/L — ABNORMAL HIGH (ref 0.0–2.0)
Bicarbonate: 34.8 mmol/L — ABNORMAL HIGH (ref 20.0–28.0)
Calcium, Ion: 1.09 mmol/L — ABNORMAL LOW (ref 1.15–1.40)
HCT: 56 % — ABNORMAL HIGH (ref 39.0–52.0)
Hemoglobin: 19 g/dL — ABNORMAL HIGH (ref 13.0–17.0)
O2 Saturation: 53 %
Patient temperature: 99
Potassium: 3.7 mmol/L (ref 3.5–5.1)
Sodium: 135 mmol/L (ref 135–145)
TCO2: 37 mmol/L — ABNORMAL HIGH (ref 22–32)
pCO2 arterial: 69.2 mmHg (ref 32.0–48.0)
pH, Arterial: 7.311 — ABNORMAL LOW (ref 7.350–7.450)
pO2, Arterial: 32 mmHg — CL (ref 83.0–108.0)

## 2019-10-28 LAB — I-STAT CHEM 8, ED
BUN: 25 mg/dL — ABNORMAL HIGH (ref 8–23)
Calcium, Ion: 1.07 mmol/L — ABNORMAL LOW (ref 1.15–1.40)
Chloride: 96 mmol/L — ABNORMAL LOW (ref 98–111)
Creatinine, Ser: 1.2 mg/dL (ref 0.61–1.24)
Glucose, Bld: 320 mg/dL — ABNORMAL HIGH (ref 70–99)
HCT: 60 % — ABNORMAL HIGH (ref 39.0–52.0)
Hemoglobin: 20.4 g/dL — ABNORMAL HIGH (ref 13.0–17.0)
Potassium: 4.2 mmol/L (ref 3.5–5.1)
Sodium: 139 mmol/L (ref 135–145)
TCO2: 33 mmol/L — ABNORMAL HIGH (ref 22–32)

## 2019-10-28 LAB — LACTIC ACID, PLASMA
Lactic Acid, Venous: 2.1 mmol/L (ref 0.5–1.9)
Lactic Acid, Venous: 1.4 mmol/L (ref 0.5–1.9)

## 2019-10-28 LAB — HEMOGLOBIN A1C
Hgb A1c MFr Bld: 8.9 % — ABNORMAL HIGH (ref 4.8–5.6)
Mean Plasma Glucose: 208.73 mg/dL

## 2019-10-28 LAB — PROCALCITONIN: Procalcitonin: 2.18 ng/mL

## 2019-10-28 LAB — CBG MONITORING, ED: Glucose-Capillary: 291 mg/dL — ABNORMAL HIGH (ref 70–99)

## 2019-10-28 LAB — BETA-HYDROXYBUTYRIC ACID: Beta-Hydroxybutyric Acid: 0.15 mmol/L (ref 0.05–0.27)

## 2019-10-28 LAB — SARS CORONAVIRUS 2 BY RT PCR (HOSPITAL ORDER, PERFORMED IN ~~LOC~~ HOSPITAL LAB): SARS Coronavirus 2: NEGATIVE

## 2019-10-28 LAB — HIV ANTIBODY (ROUTINE TESTING W REFLEX): HIV Screen 4th Generation wRfx: NONREACTIVE

## 2019-10-28 MED ORDER — FUROSEMIDE 10 MG/ML IJ SOLN
40.0000 mg | Freq: Once | INTRAMUSCULAR | Status: AC
  Administered 2019-10-28: 40 mg via INTRAVENOUS
  Filled 2019-10-28: qty 4

## 2019-10-28 MED ORDER — OXYCODONE-ACETAMINOPHEN 10-325 MG PO TABS: 1.0000 | ORAL_TABLET | Freq: Four times a day (QID) | ORAL | Status: DC

## 2019-10-28 MED ORDER — PREGABALIN 75 MG PO CAPS
150.0000 mg | ORAL_CAPSULE | Freq: Two times a day (BID) | ORAL | Status: DC
  Administered 2019-10-29 – 2019-11-05 (×13): 150 mg via ORAL
  Filled 2019-10-28: qty 2
  Filled 2019-10-28: qty 1
  Filled 2019-10-28 (×10): qty 2
  Filled 2019-10-28: qty 6
  Filled 2019-10-28: qty 2

## 2019-10-28 MED ORDER — FAMOTIDINE 20 MG PO TABS
20.0000 mg | ORAL_TABLET | Freq: Two times a day (BID) | ORAL | Status: DC
  Administered 2019-10-29 – 2019-11-05 (×14): 20 mg via ORAL
  Filled 2019-10-28 (×14): qty 1

## 2019-10-28 MED ORDER — ONDANSETRON HCL 4 MG PO TABS: 4.0000 mg | ORAL_TABLET | Freq: Four times a day (QID) | ORAL | Status: DC | PRN

## 2019-10-28 MED ORDER — LEVETIRACETAM 500 MG PO TABS
500.0000 mg | ORAL_TABLET | Freq: Two times a day (BID) | ORAL | Status: DC
  Administered 2019-10-29: 500 mg via ORAL
  Filled 2019-10-28: qty 1

## 2019-10-28 MED ORDER — SODIUM CHLORIDE 0.9% FLUSH
3.0000 mL | Freq: Two times a day (BID) | INTRAVENOUS | Status: DC
  Administered 2019-10-29 – 2019-11-04 (×8): 3 mL via INTRAVENOUS

## 2019-10-28 MED ORDER — RISAQUAD PO CAPS
2.0000 | ORAL_CAPSULE | Freq: Every day | ORAL | Status: DC
  Administered 2019-10-30 – 2019-11-05 (×7): 2 via ORAL
  Filled 2019-10-28 (×8): qty 2

## 2019-10-28 MED ORDER — ASPIRIN 81 MG PO CHEW
81.0000 mg | CHEWABLE_TABLET | Freq: Every day | ORAL | Status: DC
  Administered 2019-10-30 – 2019-11-05 (×7): 81 mg via ORAL
  Filled 2019-10-28 (×7): qty 1

## 2019-10-28 MED ORDER — ACETAMINOPHEN 325 MG PO TABS
650.0000 mg | ORAL_TABLET | Freq: Four times a day (QID) | ORAL | Status: DC | PRN
  Administered 2019-10-29: 650 mg via ORAL
  Filled 2019-10-28: qty 2

## 2019-10-28 MED ORDER — VANCOMYCIN HCL 1250 MG/250ML IV SOLN
1250.0000 mg | INTRAVENOUS | Status: DC
  Administered 2019-10-29 – 2019-11-01 (×4): 1250 mg via INTRAVENOUS
  Filled 2019-10-28 (×4): qty 250

## 2019-10-28 MED ORDER — IPRATROPIUM-ALBUTEROL 0.5-2.5 (3) MG/3ML IN SOLN: 3.0000 mL | RESPIRATORY_TRACT | Status: DC

## 2019-10-28 MED ORDER — ACETAMINOPHEN 650 MG RE SUPP: 650.0000 mg | Freq: Four times a day (QID) | RECTAL | Status: DC | PRN

## 2019-10-28 MED ORDER — LACTATED RINGERS IV SOLN: INTRAVENOUS | Status: DC

## 2019-10-28 MED ORDER — VANCOMYCIN HCL IN DEXTROSE 1-5 GM/200ML-% IV SOLN: 1000.0000 mg | Freq: Once | INTRAVENOUS | Status: DC

## 2019-10-28 MED ORDER — DIAZEPAM 5 MG PO TABS
5.0000 mg | ORAL_TABLET | Freq: Three times a day (TID) | ORAL | Status: DC
  Administered 2019-10-29 – 2019-11-01 (×7): 5 mg via ORAL
  Filled 2019-10-28 (×8): qty 1

## 2019-10-28 MED ORDER — OXYCODONE-ACETAMINOPHEN 5-325 MG PO TABS
1.0000 | ORAL_TABLET | Freq: Two times a day (BID) | ORAL | Status: DC | PRN
  Administered 2019-11-02: 1 via ORAL

## 2019-10-28 MED ORDER — OXYCODONE HCL 5 MG PO TABS
5.0000 mg | ORAL_TABLET | Freq: Four times a day (QID) | ORAL | Status: DC
  Administered 2019-10-29 – 2019-11-05 (×23): 5 mg via ORAL
  Filled 2019-10-28 (×24): qty 1

## 2019-10-28 MED ORDER — INSULIN ASPART 100 UNIT/ML ~~LOC~~ SOLN
0.0000 [IU] | Freq: Three times a day (TID) | SUBCUTANEOUS | Status: DC
  Administered 2019-10-29: 5 [IU] via SUBCUTANEOUS
  Administered 2019-10-29: 2 [IU] via SUBCUTANEOUS
  Administered 2019-10-30 (×3): 3 [IU] via SUBCUTANEOUS
  Administered 2019-10-31: 11 [IU] via SUBCUTANEOUS
  Administered 2019-10-31: 5 [IU] via SUBCUTANEOUS
  Administered 2019-11-01 (×3): 3 [IU] via SUBCUTANEOUS
  Administered 2019-11-02 (×2): 2 [IU] via SUBCUTANEOUS
  Administered 2019-11-02: 8 [IU] via SUBCUTANEOUS
  Administered 2019-11-03 (×2): 3 [IU] via SUBCUTANEOUS
  Administered 2019-11-03: 2 [IU] via SUBCUTANEOUS
  Administered 2019-11-04: 5 [IU] via SUBCUTANEOUS
  Administered 2019-11-04 (×2): 2 [IU] via SUBCUTANEOUS
  Administered 2019-11-05: 3 [IU] via SUBCUTANEOUS

## 2019-10-28 MED ORDER — INSULIN ASPART 100 UNIT/ML ~~LOC~~ SOLN
0.0000 [IU] | Freq: Every day | SUBCUTANEOUS | Status: DC
  Administered 2019-10-28: 3 [IU] via SUBCUTANEOUS
  Administered 2019-10-31: 2 [IU] via SUBCUTANEOUS

## 2019-10-28 MED ORDER — ALBUTEROL SULFATE (2.5 MG/3ML) 0.083% IN NEBU: 2.5000 mg | INHALATION_SOLUTION | RESPIRATORY_TRACT | Status: DC | PRN

## 2019-10-28 MED ORDER — TAMSULOSIN HCL 0.4 MG PO CAPS
0.8000 mg | ORAL_CAPSULE | Freq: Every day | ORAL | Status: DC
  Administered 2019-10-30 – 2019-11-05 (×7): 0.8 mg via ORAL
  Filled 2019-10-28 (×7): qty 2

## 2019-10-28 MED ORDER — SODIUM CHLORIDE 0.9 % IV SOLN
2.0000 g | Freq: Once | INTRAVENOUS | Status: AC
  Administered 2019-10-28: 2 g via INTRAVENOUS
  Filled 2019-10-28: qty 2

## 2019-10-28 MED ORDER — SODIUM CHLORIDE 0.9 % IV SOLN
2.0000 g | Freq: Three times a day (TID) | INTRAVENOUS | Status: DC
  Administered 2019-10-29 – 2019-10-30 (×5): 2 g via INTRAVENOUS
  Filled 2019-10-28 (×5): qty 2

## 2019-10-28 MED ORDER — MORPHINE SULFATE (PF) 2 MG/ML IV SOLN
2.0000 mg | INTRAVENOUS | Status: DC | PRN
  Administered 2019-10-29: 2 mg via INTRAVENOUS
  Filled 2019-10-28: qty 1

## 2019-10-28 MED ORDER — LACTATED RINGERS IV BOLUS
1000.0000 mL | Freq: Once | INTRAVENOUS | Status: AC
  Administered 2019-10-28: 1000 mL via INTRAVENOUS

## 2019-10-28 MED ORDER — ENOXAPARIN SODIUM 40 MG/0.4ML ~~LOC~~ SOLN
40.0000 mg | SUBCUTANEOUS | Status: DC
  Administered 2019-10-28 – 2019-11-04 (×8): 40 mg via SUBCUTANEOUS
  Filled 2019-10-28 (×8): qty 0.4

## 2019-10-28 MED ORDER — OXYCODONE-ACETAMINOPHEN 5-325 MG PO TABS
1.0000 | ORAL_TABLET | Freq: Four times a day (QID) | ORAL | Status: DC
  Administered 2019-10-29 – 2019-11-05 (×21): 1 via ORAL
  Filled 2019-10-28 (×23): qty 1

## 2019-10-28 MED ORDER — BACLOFEN 10 MG PO TABS
10.0000 mg | ORAL_TABLET | Freq: Three times a day (TID) | ORAL | Status: DC
  Administered 2019-10-29 – 2019-11-01 (×8): 10 mg via ORAL
  Filled 2019-10-28 (×11): qty 1

## 2019-10-28 MED ORDER — NALOXEGOL OXALATE 25 MG PO TABS
25.0000 mg | ORAL_TABLET | Freq: Every day | ORAL | Status: DC
  Administered 2019-10-30 – 2019-11-05 (×7): 25 mg via ORAL
  Filled 2019-10-28 (×8): qty 1

## 2019-10-28 MED ORDER — VANCOMYCIN HCL 1500 MG/300ML IV SOLN
1500.0000 mg | Freq: Once | INTRAVENOUS | Status: AC
  Administered 2019-10-28: 1500 mg via INTRAVENOUS
  Filled 2019-10-28: qty 300

## 2019-10-28 MED ORDER — DOCUSATE SODIUM 100 MG PO CAPS
100.0000 mg | ORAL_CAPSULE | Freq: Two times a day (BID) | ORAL | Status: DC
  Administered 2019-10-30 – 2019-11-05 (×9): 100 mg via ORAL
  Filled 2019-10-28 (×10): qty 1

## 2019-10-28 MED ORDER — AMLODIPINE BESYLATE 5 MG PO TABS
5.0000 mg | ORAL_TABLET | Freq: Every day | ORAL | Status: DC
  Administered 2019-10-30 – 2019-11-05 (×6): 5 mg via ORAL
  Filled 2019-10-28 (×7): qty 1

## 2019-10-28 MED ORDER — SERTRALINE HCL 50 MG PO TABS
75.0000 mg | ORAL_TABLET | Freq: Every day | ORAL | Status: DC
  Administered 2019-10-30 – 2019-11-05 (×6): 75 mg via ORAL
  Filled 2019-10-28: qty 1
  Filled 2019-10-28 (×7): qty 2

## 2019-10-28 MED ORDER — ONDANSETRON HCL 4 MG/2ML IJ SOLN
4.0000 mg | Freq: Four times a day (QID) | INTRAMUSCULAR | Status: DC | PRN
  Administered 2019-10-29: 4 mg via INTRAVENOUS
  Filled 2019-10-28: qty 2

## 2019-10-28 NOTE — Progress Notes (Signed)
Patient seen for increased lethargy.  He was admitted earlier today after presenting from ALF with a day of AMS and cough, found to have PNA with negative COVID pcr and acute hypoxic and hypercarbic respiratory failure, was seen by PCCM, admitted to hospitalist service, and started on antibiotics and supplemental O2.    He is sleeping, easily woken but quickly falls back asleep. O2 saturations 88% on BiPAP with 60% FiO2. HR, BP, and RR all normal. He is not pale or cyanotic.   Increased lethargy could reflect worsening hypercarbia. ABG ordered though patient already on BiPAP and confirmed to be DNR. He has peripheral edema and we can try diuresing. Plan to stop IVF for now and give one time dose Lasix, continue BiPAP and close monitoring.

## 2019-10-28 NOTE — Progress Notes (Signed)
RT assessed patient coming in from EMS on CPAP. Patient placed on NRB with O2 sats of 95%. Patient tolerating well at this time. No complications. Will continue to assess.

## 2019-10-28 NOTE — ED Provider Notes (Signed)
[facility] EMERGENCY DEPARTMENT Provider Note   CSN: [id number] Arrival date & time: 10/28/19  1453     History Chief Complaint  Patient presents with  . Shortness of Breath    Sergio Murphy is a 64 y.o. male.  The history is provided by the patient, the nursing home and the EMS personnel.  Shortness of Breath Severity:  Severe Onset quality:  Gradual Timing:  Constant Progression:  Worsening Chronicity:  New Context: URI   Relieved by:  Nothing Worsened by:  Nothing Ineffective treatments:  None tried Associated symptoms: cough and sputum production   Associated symptoms: no chest pain, no fever, no headaches, no rash and no vomiting        No past medical history on file.  Patient Active Problem List   Diagnosis Date Noted  . Acute on chronic respiratory failure with hypoxia (HCC) 11/17/2017  . Dysphagia 11/17/2017  . Severe sepsis with septic shock (HCC) 11/17/2017  . Pneumonia due to Pseudomonas (HCC) 11/17/2017  . Cerebral palsy (HCC) 11/17/2017  . Seizure disorder (HCC) 11/17/2017     The histories are not reviewed yet. Please review them in the "History" navigator section and refresh this SmartLink.     No family history on file.  Social History   Tobacco Use  . Smoking status: Not on file  Substance Use Topics  . Alcohol use: Not on file  . Drug use: Not on file    Home Medications Prior to Admission medications   Not on File    Allergies    Patient has no allergy information on record.  Review of Systems   Review of Systems  Constitutional: Negative for chills and fever.  HENT: Negative for congestion and rhinorrhea.   Respiratory: Positive for cough, sputum production and shortness of breath.   Cardiovascular: Negative for chest pain and palpitations.  Gastrointestinal: Positive for constipation. Negative for diarrhea, nausea and vomiting.  Genitourinary: Negative for difficulty urinating and dysuria.   Musculoskeletal: Negative for arthralgias and back pain.  Skin: Positive for wound (chronic). Negative for color change and rash.  Neurological: Negative for light-headedness and headaches.    Physical Exam Updated Vital Signs BP (!) 135/109   Pulse 97   Resp 16   Ht 5\' 6"  (1.676 m)   Wt 97.8 kg   SpO2 92%   BMI 34.80 kg/m   Physical Exam Vitals and nursing note reviewed.  Constitutional:      General: He is not in acute distress.    Appearance: Normal appearance. He is ill-appearing.  HENT:     Head: Normocephalic and atraumatic.     Nose: No rhinorrhea.  Eyes:     General:        Right eye: No discharge.        Left eye: No discharge.     Conjunctiva/sclera: Conjunctivae normal.  Cardiovascular:     Rate and Rhythm: Normal rate and regular rhythm.  Pulmonary:     Effort: Pulmonary effort is normal.     Breath sounds: No stridor. Rhonchi (throughout) present.  Chest:     Chest wall: No tenderness.  Abdominal:     General: Abdomen is flat. There is no distension.     Palpations: Abdomen is soft.     Tenderness: There is no abdominal tenderness.  Musculoskeletal:        General: No deformity or signs of injury.     Right lower leg: No tenderness. Edema present.  Left lower leg: No tenderness. Edema present.  Skin:    General: Skin is warm and dry.     Comments: Large stage I decubitus ulcer in the sacrum  Neurological:     General: No focal deficit present.     Mental Status: He is alert. Mental status is at baseline.     Motor: No weakness.  Psychiatric:        Mood and Affect: Mood normal.        Behavior: Behavior normal.        Thought Content: Thought content normal.     ED Results / Procedures / Treatments   Labs (all labs ordered are listed, but only abnormal results are displayed) Labs Reviewed  LACTIC ACID, PLASMA - Abnormal; Notable for the following components:      Result Value   Lactic Acid, Venous 2.1 (*)    All other components  within normal limits  COMPREHENSIVE METABOLIC PANEL - Abnormal; Notable for the following components:   Chloride 95 (*)    Glucose, Bld 315 (*)    Creatinine, Ser 1.34 (*)    Calcium 8.3 (*)    Albumin 3.1 (*)    Total Bilirubin 1.3 (*)    GFR calc non Af Amer 56 (*)    All other components within normal limits  CBC WITH DIFFERENTIAL/PLATELET - Abnormal; Notable for the following components:   WBC 14.6 (*)    RBC 6.28 (*)    Hemoglobin 17.6 (*)    HCT 58.7 (*)    Neutro Abs 12.5 (*)    Monocytes Absolute 1.1 (*)    Abs Immature Granulocytes 0.10 (*)    All other components within normal limits  D-DIMER, QUANTITATIVE ([facility] AT [facility]) - Abnormal; Notable for the following components:   D-Dimer, Quant 0.57 (*)    All other components within normal limits  PROTIME-INR - Abnormal; Notable for the following components:   INR 1.3 (*)    All other components within normal limits  I-STAT VENOUS BLOOD GAS, ED - Abnormal; Notable for the following components:   pCO2, Ven 66.9 (*)    pO2, Ven 85.0 (*)    Bicarbonate 34.3 (*)    TCO2 36 (*)    Acid-Base Excess 5.0 (*)    Calcium, Ion 1.06 (*)    HCT 60.0 (*)    Hemoglobin 20.4 (*)    All other components within normal limits  I-STAT CHEM 8, ED - Abnormal; Notable for the following components:   Chloride 96 (*)    BUN 25 (*)    Glucose, Bld 320 (*)    Calcium, Ion 1.07 (*)    TCO2 33 (*)    Hemoglobin 20.4 (*)    HCT 60.0 (*)    All other components within normal limits  CULTURE, BLOOD (SINGLE)  URINE CULTURE  SARS CORONAVIRUS 2 BY RT PCR (HOSPITAL ORDER, PERFORMED IN [facility])  CULTURE, BLOOD (SINGLE)  BETA-HYDROXYBUTYRIC ACID  APTT  LACTIC ACID, PLASMA  URINALYSIS, ROUTINE W REFLEX MICROSCOPIC  COMPREHENSIVE METABOLIC PANEL  CBG MONITORING, ED    EKG None  Radiology DG Chest Port 1 View  Result Date: 10/28/2019 CLINICAL DATA:  Possible sepsis. EXAM: PORTABLE CHEST 1 VIEW COMPARISON:  Chest x-ray dated  November 19, 2017. FINDINGS: The patient is rotated to the left. Cardiomegaly. Increased reticulonodular interstitial thickening at the right lung base. Moderate left pleural effusion with dense retrocardiac opacity. No pneumothorax. Unchanged calcified granuloma at the right lung  base. No acute osseous abnormality. IMPRESSION: 1. Moderate left pleural effusion with left basilar atelectasis versus infiltrate. 2. Increased reticulonodular interstitial thickening at the right lung base may be infectious or inflammatory. Electronically Signed   By: Obie Dredge M.D.   On: 10/28/2019 15:40    Procedures Procedures (including critical care time)  Medications Ordered in ED Medications  ipratropium-albuterol (DUONEB) 0.5-2.5 (3) MG/3ML nebulizer solution 3 mL (3 mLs Nebulization Not Given 10/28/19 1722)  vancomycin (VANCOREADY) IVPB 1500 mg/300 mL (has no administration in time range)    Followed by  vancomycin (VANCOREADY) IVPB 1250 mg/250 mL (has no administration in time range)  ceFEPIme (MAXIPIME) 2 g in sodium chloride 0.9 % 100 mL IVPB (has no administration in time range)  lactated ringers bolus 1,000 mL (0 mLs Intravenous Stopped 10/28/19 1650)  ceFEPIme (MAXIPIME) 2 g in sodium chloride 0.9 % 100 mL IVPB (0 g Intravenous Stopped 10/28/19 1730)    ED Course  I have reviewed the triage vital signs and the nursing notes.  Pertinent labs & imaging results that were available during my care of the patient were reviewed by me and considered in my medical decision making (see chart for details).    MDM Rules/Calculators/A&P                          Patient came in EMS on CPAP with shortness of breath altered mental status.  Was given a breathing treatment in route, had marked improvement of mental status upon arrival and O2 sats were much improved.  Reported O2 sats in the 50% range at facility.  Patient has been feeling poorly last few days has had congestion and sputum production.  He has  multiple chronic medical issues including cerebral palsy.  He is giving a breathing treatment in route and that may have contributed to his improvement.  He has a history of cigarette smoking.  He will be given DuoNeb's here.  His heart rate is over 90 he is tachypneic.  He had new oxygen requirement.  He is afebrile by oral temperature.  The could be evolving sepsis, he will get fluids, his blood pressure is normal.  Does not need 30/kg of fluid at this time.  He will get culture data from urine and blood, screening labs, with the onset of shortness of breath, will get screen for PE.  His abdomen is soft.  He says he has chronic constipation due to chronic pain control but a new medication is helping well with that.  He also has a history of recurrent UTIs.  I attempted to call family to get an update to give him information he was here, there was no answer.  Attempted to try to call his facility, to get an answer how he has been doing lately, I spoke to 4 providers there no one could give me information, but he said his nurse was on break.  Patient has new oxygen requirement, has an x-ray reviewed by radiology myself that shows multiple areas of concern especially within the left lung for possible infectious etiology.  Patient has mild acidosis with an elevated CO2.  CPAP probably helped him, he may need additional treatment in house.  Antibiotics are started.  Laboratory studies show marked elevation in creatinine lactic acidosis.  He will get a repeat lactic acidosis.  D-dimer age-adjusted is unremarkable does not need further work-up.  He remains on nonrebreather satting anywhere from 90 to 96%.  He is  received breathing treatments.  He is receive antibiotics for healthcare associated pneumonia.  He will be admitted to the hospital.  The patient will be admitted to the hospitalist.  For the remainder this patient's care please see inpatient team notes.  I will intervene as needed while the patient  remains in the emergency department.  Final Clinical Impression(s) / ED Diagnoses Final diagnoses:  Hypoxia  Healthcare-associated pneumonia  AKI (acute kidney injury) (HCC)  Lactic acid acidosis  Hypercapnia    Rx / DC Orders ED Discharge Orders    None       Sabino Donovan, MD 10/28/19 1731

## 2019-10-28 NOTE — Progress Notes (Signed)
ABG collected and the results told to the nurse.

## 2019-10-28 NOTE — Consult Note (Signed)
NAME:  Sergio Murphy, MRN:  [id number], DOB:  [birth year], LOS: 0 ADMISSION DATE:  10/28/2019, CONSULTATION DATE:  07/28/2019 REFERRING MD:  Jonah Blue, MD, CHIEF COMPLAINT:  SOB  Brief History   64 year old male with history of cerebral palsy coming in from assisted living with shortness of breath cough and altered mental status for 1 day.  X-ray chest suggestive of multifocal pneumonia left-sided effusion.  Requiring nonrebreather mask oxygen.  Critical care was consulted for evaluation of hypoxia History of present illness   64 year old male with history of cerebral palsy who was brought into the emergency department from assisted living with complaint of shortness of breath cough and altered mental status starting yesterday.  As per patient he was in his usual state of health until yesterday when he started getting cough and shortness of breath, it got worse and last 24 hours, so EMS was called in.  Upon EMS arrival patient was noted to have O2 sat in 50s, he was put on nonrebreather with improvement in oxygenation.  Past Medical History  Cerebral palsy Obesity Ex-smoker Diabetes type 2 Hypertension Seizure disorder Significant Hospital Events   Came to the hospital  Consults:  PCCM  Procedures:  N/A  Significant Diagnostic Tests:    Micro Data:  9/22 Covid negative 9/22 blood culture >> 9/22 urine culture >>  Antimicrobials:  9/22 vancomycin 9/22 cefepime  Interim history/subjective:  Patient was placed on nonrebreather, he was given IV fluid and IV antibiotic therapy with improvement in oxygenation up to 95% on nonrebreather.  He is much more alert and awake.  Objective   Blood pressure 114/63, pulse 100, temperature 99 F (37.2 C), temperature source Axillary, resp. rate 14, height 5\' 6"  (1.676 m), weight 97.8 kg, SpO2 93 %.       No intake or output data in the 24 hours ending 10/28/19 1825 Filed Weights   10/28/19 1600  Weight: 97.8 kg     Examination:   Physical exam: General: Chronically ill-appearing obese male, on nonrebreather HEAENT: [place]/AT, eyes anicteric.    Dry mucous membrane Neuro: Alert, awake, following commands. Chest: Reduced air entry at left base, coarse crackles heard on the right side, no wheezes or rhonchi Heart: Regular rate and rhythm, no murmurs or gallops Abdomen: Soft, nontender, nondistended, bowel sounds present Skin: No rash   Resolved Hospital Problem list   N/A  Assessment & Plan:  Acute hypoxic/hypercapnic respiratory failure due to multifocal pneumonia Sepsis due to multifocal pneumonia Left-sided pleural effusion Cerebral palsy Acute metabolic encephalopathy Seizure disorder  Patient is currently on nonrebreather with O2 sat between 90 to 95%, he is able to talk and express that he is feeling little bit better If his nonrebreather mask is keep falling, if he continuously remain hypoxic, can try BiPAP which will help with hypercapnia and hypoxia Continue broad-spectrum antibiotics Follow-up cultures Send urine Legionella and pneumococcal antigen Continue maintenance IV fluid, considering he looks dehydrated Continue Keppra Continue insulin with fingerstick goal of 140-180 Hold antihypertensives for now  Best practice:  Diet: NPO Pain/Anxiety/Delirium protocol (if indicated): Per primary team VAP protocol (if indicated): N/A DVT prophylaxis: Subcu heparin GI prophylaxis: Famotidine Glucose control: Sliding scale insulin Mobility: As tolerated Code Status: DNR Family Communication: Primary team communicated with family Disposition: Patient can be admitted to progressive care unit  Labs   CBC: Recent Labs  Lab 10/28/19 1510 10/28/19 1537  WBC 14.6*  --   NEUTROABS 12.5*  --   HGB 17.6* 20.4*  20.4*  HCT 58.7* 60.0*  60.0*  MCV 93.5  --   PLT 187  --     Basic Metabolic Panel: Recent Labs  Lab 10/28/19 1510 10/28/19 1537  NA 135 138  139  K 4.2 4.2   4.2  CL 95* 96*  CO2 28  --   GLUCOSE 315* 320*  BUN 17 25*  CREATININE 1.34* 1.20  CALCIUM 8.3*  --    GFR: Estimated Creatinine Clearance: 68.1 mL/min (by C-G formula based on SCr of 1.2 mg/dL). Recent Labs  Lab 10/28/19 1510 10/28/19 1710  WBC 14.6*  --   LATICACIDVEN 2.1* 1.4    Liver Function Tests: Recent Labs  Lab 10/28/19 1510  AST 28  ALT 16  ALKPHOS 103  BILITOT 1.3*  PROT 8.0  ALBUMIN 3.1*   No results for input(s): LIPASE, AMYLASE in the last 168 hours. No results for input(s): AMMONIA in the last 168 hours.  ABG    Component Value Date/Time   HCO3 34.3 (H) 10/28/2019 1537   TCO2 36 (H) 10/28/2019 1537   TCO2 33 (H) 10/28/2019 1537   O2SAT 95.0 10/28/2019 1537     Coagulation Profile: Recent Labs  Lab 10/28/19 1530  INR 1.3*    Cardiac Enzymes: No results for input(s): CKTOTAL, CKMB, CKMBINDEX, TROPONINI in the last 168 hours.  HbA1C: Hgb A1c MFr Bld  Date/Time Value Ref Range Status  11/14/2017 05:51 AM 8.1 (H) 4.8 - 5.6 % Final    Comment:    (NOTE) Pre diabetes:          5.7%-6.4% Diabetes:              >6.4% Glycemic control for   <7.0% adults with diabetes     CBG: No results for input(s): GLUCAP in the last 168 hours.  Review of Systems:   Positive for shortness of breath, cough, fever, constipation and lethargy. Rest of the 12 point review of system is negative  Past Medical History  He,  has a past medical history of Cerebral palsy, quadriplegic (HCC), Chronic pain disorder, Class 1 obesity with body mass index (BMI) of 34.0 to 34.9 in adult, Depression with anxiety, Diabetes mellitus type 2 in obese [facility]), DNR (do not resuscitate), Essential hypertension, and Seizure disorder (HCC).   Surgical History    Past Surgical History:  Procedure Laterality Date  . IR GASTROSTOMY TUBE REMOVAL  01/16/2018     Social History   reports that he has quit smoking. He has never used smokeless tobacco. He reports previous alcohol  use. He reports previous drug use.   Family History   His family history is not on file.   Allergies Allergies  Allergen Reactions  . Latex Swelling  . Benazepril Other (See Comments)    AKI and hyperkalemia  . Carbamazepine Other (See Comments)    Reaction not listed on MAR     Home Medications  Prior to Admission medications   Medication Sig Start Date End Date Taking? Authorizing Provider  amLODipine (NORVASC) 5 MG tablet Take 5 mg by mouth daily. 10/20/19   [provider]  Baclofen 5 MG TABS  10/19/19   [provider]  diazepam (VALIUM) 5 MG tablet Take 5 mg by mouth 3 (three) times daily as needed. 10/23/19   [provider]  famotidine (PEPCID) 40 MG tablet  07/20/19   [provider]  furosemide (LASIX) 20 MG tablet Take 20 mg by mouth 2 (two) times daily. 10/13/19   [provider]  levETIRAcetam (KEPPRA) 500 MG tablet  07/24/19   [provider]  oxyCODONE-acetaminophen (PERCOCET) 10-325 MG tablet  07/23/19   [provider]  potassium chloride SA (KLOR-CON) 20 MEQ tablet  07/21/19   [provider]  pregabalin (LYRICA) 150 MG capsule  07/11/19   [provider]  sodium chloride 0.9 % infusion Inject into the vein. 10/28/19   [provider]  tamsulosin (FLOMAX) 0.4 MG CAPS capsule  07/14/19   [provider]  traZODone (DESYREL) 50 MG tablet  05/07/19   [provider]  TRULICITY 0.75 MG/0.5ML SOPN SMARTSIG:0.5 Milliliter(s) SUB-Q Once a Week 10/24/19   [provider]     Total critical care time: 37 minutes  Performed by: Cheri Fowler   Critical care time was exclusive of separately billable procedures and treating other patients.   Critical care was necessary to treat or prevent imminent or life-threatening deterioration.   Critical care was time spent personally by me on the following activities: development of treatment plan with patient and/or surrogate as  well as nursing, discussions with consultants, evaluation of patient's response to treatment, examination of patient, obtaining history from patient or surrogate, ordering and performing treatments and interventions, ordering and review of laboratory studies, ordering and review of radiographic studies, pulse oximetry and re-evaluation of patient's condition.   Cheri Fowler MD Critical care physician [facility]  Pager: [phone] Mobile: [phone]

## 2019-10-28 NOTE — Progress Notes (Signed)
Pharmacy Antibiotic Note  Sergio Murphy is a 64 y.o. male admitted on 10/28/2019 with pneumonia.  Pharmacy has been consulted for Cefepime and Vancomycin dosing.   Height: 5\' 6"  (167.6 cm) Weight: 97.8 kg (215 lb 9.8 oz) IBW/kg (Calculated) : 63.8  No data recorded.  Recent Labs  Lab 10/28/19 1510 10/28/19 1537  WBC 14.6*  --   CREATININE 1.34* 1.20  LATICACIDVEN 2.1*  --     Estimated Creatinine Clearance: 68.1 mL/min (by C-G formula based on SCr of 1.2 mg/dL).    Not on File  Antimicrobials this admission: 9/22 Cefepime >>  9/22 Vancomycin >>   Dose adjustments this admission: N/a  Microbiology results: Pending   Plan:  - Cefepime 2g IV q8h - Vancomycin 1500mg  IV x 1 dose  - Followed by Vancomycin 1250mg  IV q24h - Est Calc AUC 501 - Monitor patients renal function and urine output  - De-escalate ABX when appropriate   Thank you for allowing pharmacy to be a part of this patient's care.  10/22 PharmD. BCPS 10/28/2019 4:40 PM

## 2019-10-28 NOTE — Progress Notes (Signed)
Patient placed on BIPAP. Pt is tolerating it well at this time. V/S are normal rr 14 pulse 91 bp 127/77 and sats 90. Nurse is aware of it. RT will continue to monitor.

## 2019-10-28 NOTE — ED Notes (Signed)
Paged MD of acute change of pt

## 2019-10-28 NOTE — ED Notes (Signed)
Pt verbally states sister may get updates.

## 2019-10-28 NOTE — Sepsis Progress Note (Cosign Needed)
Notified bedside nurse of need to draw repeat lactic acid. 

## 2019-10-28 NOTE — ED Triage Notes (Signed)
Pt arrived by [facility] from [facility] complaining of shortness of breath. Arrived with CPAP on and sats in the low 90s. Medics report diaphoretic and lethargic  Pt given some fluids by facility and an albuterol neb by EMS   Hx of CP is wheelchair bound at baseline  Pt is COIVD vaccinated

## 2019-10-28 NOTE — H&P (Signed)
History and Physical    Sergio Murphy ZOX:[id number] DOB: 1956-02-02 DOA: 10/28/2019  PCP: Shayne AlkenBrown, Stephanie Delores, MD Consultants:  None [facility]; NOK: Maxine Glennerri Haddock, [phone], sister, listed as POA; okay to speak with Denna Haggardonnie Harmon if she calls or is here   Chief Complaint:  SOB  HPI: Sergio Murphy is a 64 y.o. male with medical history significant of depression/anxiety; HTN; DM; chronic pain syndrome; prior chronic trach-dependent respiratory failure (discontinued about 2 years ago); cerebral palsy with seizure disorder; prior PEG placement presenting with SOB.  He is a poor historian and is uncertain when he had his trach removed.  His primary concern is eating/drinking and pain medication - despite O2 sats in the upper 80s every time his NRB moves at all out of place.  He reports symptoms for some days.  He says they tested him multiple times at his facility for COVID and he is not aware if any of the tests were positive.  His sister is uncertain what is going on - she is in [place] but lives in [place] and has not heard from the facility.  He wears O2 off and on.  His sister is his POA reports NO repeat trach, DNR.    ED Course:  Cerebral palsy, wheelchair-bound.  Chronic respiratory issues and tobacco dependence but not on COPD meds.  In 8050s at SNF and given a neb.  Placed on CPAP -> NRB, weaning now.  CXR looks terrible, effusion vs. Infiltrate.  Covered for HCAP.  COVID pending.  UA pending.  Baseline creatinine 0.4, current 1.34, likely volume deficient.    Review of Systems: As per HPI; otherwise review of systems reviewed and negative.   Ambulatory Status:  Non-ambulatory  COVID Vaccine Status:  Complete  Past Medical History:  Diagnosis Date  . Cerebral palsy, quadriplegic (HCC)   . Chronic pain disorder   . Class 1 obesity with body mass index (BMI) of 34.0 to 34.9 in adult   . Depression with anxiety   . Diabetes mellitus type 2  in obese (HCC)   . DNR (do not resuscitate)   . Essential hypertension   . Seizure disorder [facility](HCC)     Past Surgical History:  Procedure Laterality Date  . IR GASTROSTOMY TUBE REMOVAL  01/16/2018    Social History   Socioeconomic History  . Marital status: Single    Spouse name: Not on file  . Number of children: Not on file  . Years of education: Not on file  . Highest education level: Not on file  Occupational History  . Occupation: disabled  Tobacco Use  . Smoking status: Former Games developermoker  . Smokeless tobacco: Never Used  Substance and Sexual Activity  . Alcohol use: Not Currently  . Drug use: Not Currently  . Sexual activity: Not on file  Other Topics Concern  . Not on file  Social History Narrative  . Not on file   Social Determinants of Health   Financial Resource Strain:   . Difficulty of Paying Living Expenses: Not on file  Food Insecurity:   . Worried About Programme researcher, broadcasting/film/videounning Out of Food in the Last Year: Not on file  . Ran Out of Food in the Last Year: Not on file  Transportation Needs:   . Lack of Transportation (Medical): Not on file  . Lack of Transportation (Non-Medical): Not on file  Physical Activity:   . Days of Exercise per Week: Not on file  . Minutes  of Exercise per Session: Not on file  Stress:   . Feeling of Stress : Not on file  Social Connections:   . Frequency of Communication with Friends and Family: Not on file  . Frequency of Social Gatherings with Friends and Family: Not on file  . Attends Religious Services: Not on file  . Active Member of Clubs or Organizations: Not on file  . Attends Banker Meetings: Not on file  . Marital Status: Not on file  Intimate Partner Violence:   . Fear of Current or Ex-Partner: Not on file  . Emotionally Abused: Not on file  . Physically Abused: Not on file  . Sexually Abused: Not on file    Allergies  Allergen Reactions  . Latex Swelling  . Benazepril Other (See Comments)    AKI and  hyperkalemia  . Carbamazepine Other (See Comments)    Reaction not listed on MAR    History reviewed. No pertinent family history.  Prior to Admission medications   Not on File    Physical Exam: Vitals:   10/28/19 1645 10/28/19 1715 10/28/19 1730 10/28/19 1800  BP: 131/73 128/74 116/89 114/63  Pulse:  96 97 100  Resp:  16 13 14   Temp: 99 F (37.2 C)     TempSrc: Axillary     SpO2:  (!) 89% 91% 93%  Weight:      Height:         . General:  Appears chronically ill, frail, and tenuous with respiratory status (NRB continues slipping down and O2 sats plummet to the upper 80s precipitously) . Eyes:  Mildly dysconjugate gaze, ?chronic visual disturbance, normal lids, iris . ENT:  grossly normal hearing, scaly lips . Neck:  no LAD, masses or thyromegaly . Cardiovascular:  RRR, no m/r/g. No LE edema.  Respiratory:   Poor air movement diffusely along the left side.  Normal respiratory effort. . Abdomen:  soft, NT, ND, NABS . Skin:  no rash or induration seen on limited exam . Musculoskeletal:  Chronically abnormal tone BUE/BLE, no obvious contractures . Psychiatric:  grossly normal mood and affect, speech fluent and appropriate, ?mild cognitive impairment    Radiological Exams on Admission: DG Chest Port 1 View  Result Date: 10/28/2019 CLINICAL DATA:  Possible sepsis. EXAM: PORTABLE CHEST 1 VIEW COMPARISON:  Chest x-ray dated November 19, 2017. FINDINGS: The patient is rotated to the left. Cardiomegaly. Increased reticulonodular interstitial thickening at the right lung base. Moderate left pleural effusion with dense retrocardiac opacity. No pneumothorax. Unchanged calcified granuloma at the right lung base. No acute osseous abnormality. IMPRESSION: 1. Moderate left pleural effusion with left basilar atelectasis versus infiltrate. 2. Increased reticulonodular interstitial thickening at the right lung base may be infectious or inflammatory. Electronically Signed   By: November 21, 2017  M.D.   On: 10/28/2019 15:40    EKG: Independently reviewed.  NSR with rate 99; nonspecific ST changes with no evidence of acute ischemia   Labs on Admission: I have personally reviewed the available labs and imaging studies at the time of the admission.  Pertinent labs:   Glucose 315 BUN 17/Creatinine 1.34/GFR 56 Albumin 3.1 Bili 1.3 Lactate 2.1 WBC 14.6 Hgb 17.6 Beta-hydroxybutyrate 0.15 INR 1.3 D-dimer 0.57 ABG: 7.318/66.9/85.0/34.3/95%   Assessment/Plan Principal Problem:   Sepsis due to pneumonia [facility]) Active Problems:   Acute on chronic respiratory failure with hypoxia (HCC)   Seizure disorder (HCC)   HCAP (healthcare-associated pneumonia)   Cerebral palsy, quadriplegic (HCC)   Depression  with anxiety   Essential hypertension   Diabetes mellitus type 2 in obese (HCC)   Class 1 obesity with body mass index (BMI) of 34.0 to 34.9 in adult   Chronic pain disorder   Sepsis due to HCAP with acute on chronic respiratory failure -SIRS criteria in this patient includes: Leukocytosis, hypoxia to 50s (recurrent to upper 80s with minimal disruption of his NRB), tachycardia  -Patient has evidence of acute organ failure with elevated lactate >2 that is not easily explained by another condition. -While awaiting blood cultures, this appears to be a preseptic condition. -Sepsis protocol initiated -Suspected source is HCAP -Given decreased oxygen saturation, and infiltrate of basically entire left lung on chest x-ray, most likely healthcare-associated pneumonia.  -He previously had a trach (surprising that they would decannulate him given his h/o CP) and continues to use prn O2 at his facility. -COVID-19 negative. -Will order lower respiratory tract procalcitonin level.   >0.5 indicates infection and >>0.5 indicates more serious disease.  As the procalcitonin level normalizes, it will be reasonable to consider de-escalation of antibiotic coverage.  The sensitivity of procalcitonin is  variable and should not be used alone to guide treatment. -CURB-65 score is 1-2 - will admit the patient to Progressive Care -Pneumonia Severity Index (PSI) is Class 4, 9% mortality. -The patient has the following criteria for MDR (multi-drug resistance):  SNF placement -The patient will need treatment for MRSA and/or Pseudomonas due to MDR risk factors as above; will treat with Cefepime and Vancomycin. -Additional complicating factors include: hypoxia/hypoxemia; associated pleural effusions -PCCM has seen the patient and recommends CT of the chest non-urgently for further evaluation of the effusion -LR @ 100cc/hr -Fever control -Repeat CBC in am -Will add albuterol PRN -Blood and urine cultures pending -Will admit due to: hypoxemia -Will trend lactate to ensure improvement  Cerebral palsy -Previously trach/PEG dependent and surprisingly able to d/c about 2 years ago -Continue Baclofen, Valium  Chronic pain disorder -Continue standing and prn Percocet as well as Lyrica -prn Morphine if needed for breakthrough pain -I have reviewed this patient in the [place] Controlled Substances Reporting System.  He is receiving medications through his SNF and so they are not reported in the system.  HTN -Continue Norvasc, ASA - resume tomorrow -Hold Lasix  Seizure d/o -Continue Keppra  DM -Will check A1c -hold Trulicity -Cover with moderate-scale SSI  Depression/anxiety -Continue Zoloft, Valium  Obesity -Body mass index is 34.8 kg/m. -Weight loss should be encouraged -Outpatient PCP/bariatric medicine f/u encouraged  DNR -I have discussed code status with the patient and his sister; he would not desire repeat tracheostomy and his sister reports that the patient would not desire resuscitation and would prefer to die a natural death should that situation arise. -He will need a gold out of facility DNR form at the time of discharge      Note: This patient has been tested and is  negative for the novel coronavirus COVID-19.    DVT prophylaxis:  Lovenox Code Status:  DNR - confirmed with  family Family Communication: None present; I spoke with his sister/POA at the time of admission Disposition Plan:  The patient is from: SNF  Anticipated d/c is to: SNF  Anticipated d/c date will depend on clinical response to treatment, but likely several days  Patient is currently: acutely ill Consults called: PCCM; RT/TOC team Admission status: Admit - It is my clinical opinion that admission to INPATIENT is reasonable and necessary because of the expectation that this  patient will require hospital care that crosses at least 2 midnights to treat this condition based on the medical complexity of the problems presented.  Given the aforementioned information, the predictability of an adverse outcome is felt to be significant.     Jonah Blue MD [facility]   How to contact the [facility] Attending or Consulting provider 7A - 7P or covering provider during after hours 7P -7A, for this patient?  1. Check the care team in [facility] and look for a) attending/consulting TRH provider listed and b) the [facility] team listed 2. Log into [email or web address] and use [facility] universal password to access. If you do not have the password, please contact the hospital operator. 3. Locate the [facility] provider you are looking for under [facility] and page to a number that you can be directly reached. 4. If you still have difficulty reaching the provider, please page the [facility] (Director on Call) for the Hospitalists listed on amion for assistance.   10/28/2019, 6:38 PM

## 2019-10-28 NOTE — Sepsis Progress Note (Signed)
Notified bedside nurse of need to administer antibiotics.  

## 2019-10-29 ENCOUNTER — Inpatient Hospital Stay (HOSPITAL_COMMUNITY): Payer: Medicare Other

## 2019-10-29 DIAGNOSIS — G808 Other cerebral palsy: Secondary | ICD-10-CM

## 2019-10-29 DIAGNOSIS — E872 Acidosis, unspecified: Secondary | ICD-10-CM | POA: Insufficient documentation

## 2019-10-29 DIAGNOSIS — G894 Chronic pain syndrome: Secondary | ICD-10-CM

## 2019-10-29 DIAGNOSIS — N179 Acute kidney failure, unspecified: Secondary | ICD-10-CM

## 2019-10-29 DIAGNOSIS — J9621 Acute and chronic respiratory failure with hypoxia: Secondary | ICD-10-CM

## 2019-10-29 DIAGNOSIS — Z66 Do not resuscitate: Secondary | ICD-10-CM

## 2019-10-29 LAB — APTT: aPTT: 34 seconds (ref 24–36)

## 2019-10-29 LAB — BASIC METABOLIC PANEL
Anion gap: 12 (ref 5–15)
BUN: 23 mg/dL (ref 8–23)
CO2: 29 mmol/L (ref 22–32)
Calcium: 8.2 mg/dL — ABNORMAL LOW (ref 8.9–10.3)
Chloride: 96 mmol/L — ABNORMAL LOW (ref 98–111)
Creatinine, Ser: 1.06 mg/dL (ref 0.61–1.24)
GFR calc Af Amer: >60 mL/min (ref >60)
GFR calc non Af Amer: >60 mL/min (ref >60)
Glucose, Bld: 232 mg/dL — ABNORMAL HIGH (ref 70–99)
Potassium: 3.7 mmol/L (ref 3.5–5.1)
Sodium: 137 mmol/L (ref 135–145)

## 2019-10-29 LAB — I-STAT ARTERIAL BLOOD GAS, ED
Acid-Base Excess: 4 mmol/L — ABNORMAL HIGH (ref 0.0–2.0)
Bicarbonate: 34.9 mmol/L — ABNORMAL HIGH (ref 20.0–28.0)
Calcium, Ion: 1.15 mmol/L (ref 1.15–1.40)
HCT: 57 % — ABNORMAL HIGH (ref 39.0–52.0)
Hemoglobin: 19.4 g/dL — ABNORMAL HIGH (ref 13.0–17.0)
O2 Saturation: 99 %
Potassium: 3.5 mmol/L (ref 3.5–5.1)
Sodium: 142 mmol/L (ref 135–145)
TCO2: 37 mmol/L — ABNORMAL HIGH (ref 22–32)
pCO2 arterial: 72.9 mmHg (ref 32.0–48.0)
pH, Arterial: 7.288 — ABNORMAL LOW (ref 7.350–7.450)
pO2, Arterial: 167 mmHg — ABNORMAL HIGH (ref 83.0–108.0)

## 2019-10-29 LAB — URINALYSIS, ROUTINE W REFLEX MICROSCOPIC
Bilirubin Urine: NEGATIVE
Glucose, UA: NEGATIVE mg/dL
Ketones, ur: NEGATIVE mg/dL
Nitrite: NEGATIVE
Protein, ur: 30 mg/dL — AB
RBC / HPF: >50 RBC/hpf — ABNORMAL HIGH (ref 0–5)
Specific Gravity, Urine: 1.011 (ref 1.005–1.030)
WBC, UA: >50 WBC/hpf — ABNORMAL HIGH (ref 0–5)
pH: 7 (ref 5.0–8.0)

## 2019-10-29 LAB — PROTIME-INR
INR: 1.3 — ABNORMAL HIGH (ref 0.8–1.2)
Prothrombin Time: 15.2 seconds (ref 11.4–15.2)

## 2019-10-29 LAB — URINE CULTURE

## 2019-10-29 LAB — CBC
HCT: 57.2 % — ABNORMAL HIGH (ref 39.0–52.0)
Hemoglobin: 17.5 g/dL — ABNORMAL HIGH (ref 13.0–17.0)
MCH: 29.1 pg (ref 26.0–34.0)
MCHC: 30.6 g/dL (ref 30.0–36.0)
MCV: 95 fL (ref 80.0–100.0)
Platelets: 194 10*3/uL (ref 150–400)
RBC: 6.02 MIL/uL — ABNORMAL HIGH (ref 4.22–5.81)
RDW: 13.6 % (ref 11.5–15.5)
WBC: 13.7 10*3/uL — ABNORMAL HIGH (ref 4.0–10.5)
nRBC: 0 % (ref 0.0–0.2)

## 2019-10-29 LAB — BLOOD GAS, ARTERIAL
Acid-Base Excess: 6.2 mmol/L — ABNORMAL HIGH (ref 0.0–2.0)
Bicarbonate: 32.8 mmol/L — ABNORMAL HIGH (ref 20.0–28.0)
Drawn by: 331761
FIO2: 60
O2 Saturation: 97.4 %
Patient temperature: 36.9
pCO2 arterial: 73.3 mmHg (ref 32.0–48.0)
pH, Arterial: 7.272 — ABNORMAL LOW (ref 7.350–7.450)
pO2, Arterial: 99.2 mmHg (ref 83.0–108.0)

## 2019-10-29 LAB — GLUCOSE, CAPILLARY
Glucose-Capillary: 150 mg/dL — ABNORMAL HIGH (ref 70–99)
Glucose-Capillary: 169 mg/dL — ABNORMAL HIGH (ref 70–99)

## 2019-10-29 LAB — CBG MONITORING, ED: Glucose-Capillary: 220 mg/dL — ABNORMAL HIGH (ref 70–99)

## 2019-10-29 LAB — STREP PNEUMONIAE URINARY ANTIGEN: Strep Pneumo Urinary Antigen: NEGATIVE

## 2019-10-29 LAB — D-DIMER, QUANTITATIVE: D-Dimer, Quant: 0.57 ug/mL-FEU — ABNORMAL HIGH (ref 0.00–0.50)

## 2019-10-29 MED ORDER — LEVETIRACETAM IN NACL 500 MG/100ML IV SOLN
500.0000 mg | Freq: Two times a day (BID) | INTRAVENOUS | Status: DC
  Administered 2019-10-29 – 2019-11-02 (×10): 500 mg via INTRAVENOUS
  Filled 2019-10-29 (×11): qty 100

## 2019-10-29 MED ORDER — HYDROMORPHONE HCL 1 MG/ML IJ SOLN
0.5000 mg | INTRAMUSCULAR | Status: DC | PRN
  Administered 2019-10-30 (×3): 0.5 mg via INTRAVENOUS
  Filled 2019-10-29 (×3): qty 1

## 2019-10-29 NOTE — Progress Notes (Signed)
PROGRESS NOTE  KEY CEN ZOX:[id number] DOB: 1955/02/08 DOA: 10/28/2019 PCP: Rosaria Ferries, MD   LOS: 1 day   Brief Narrative / Interim history: 64 year old male with history of cerebral palsy with seizure disorder, prior chronic trach-dependent respiratory failure, discontinued 2 years ago, prior PEG placement, HTN, DM 2, chronic pain syndrome came in with shortness of breath.  Patient tells me that this was rather abrupt getting worse over the last couple of days and decided to come to the hospital.  In the ED he required nonrebreather and eventually he was placed on BiPAP.  PCCM consulted.  Subjective / 24h Interval events: He is wearing the BiPAP this morning, but appears to be very alert, wakes up, states that his breathing is much better.  Assessment & Plan: Principal Problem Sepsis due to HCAP with acute on chronic hypoxic and hypercarbic respiratory failure, acute respiratory acidosis -Patient met criteria for sepsis with leukocytosis, tachycardia, source. -Continue BiPAP, ABG this morning actually looks a little bit worse with a pH of 7.28, PCO2 72.9 and PO2 167. -He is DNR per prior discussion of the admitting hospitalist -Critical care following -Continue broad-spectrum antibiotics with vancomycin and cefepime, cultures are pending  Active Problems Cerebral palsy -Previously trach/PEG dependent and able to DC about 2 years ago -Continue home medications  Chronic pain disorder -continue home medications  HTN -BP stable -Hold Lasix  Seizure d/o -Continue Keppra  DM -Will check A1c, cover SSI  CBG (last 3)  Recent Labs    10/28/19 2346 10/29/19 0809  GLUCAP 291* 220*   Depression/anxiety -Continue Zoloft, Valium  Obesity -Body mass index is 34.8 kg/m.  DNR -admitting MD had discussed code status with the patient and his sister; he would not desire repeat tracheostomy and his sister reports that the patient would not desire  resuscitation and would prefer to die a natural death should that situation arise. -He will need a gold out of facility DNR form at the time of discharge  Scheduled Meds: . acidophilus  2 capsule Oral Daily  . amLODipine  5 mg Oral Daily  . aspirin  81 mg Oral Daily  . baclofen  10 mg Oral TID  . diazepam  5 mg Oral TID  . docusate sodium  100 mg Oral BID  . enoxaparin (LOVENOX) injection  40 mg Subcutaneous Q24H  . famotidine  20 mg Oral BID  . insulin aspart  0-15 Units Subcutaneous TID WC  . insulin aspart  0-5 Units Subcutaneous QHS  . levETIRAcetam  500 mg Oral BID  . naloxegol oxalate  25 mg Oral Daily  . oxyCODONE-acetaminophen  1 tablet Oral Q6H   And  . oxyCODONE  5 mg Oral Q6H  . pregabalin  150 mg Oral Q12H  . sertraline  75 mg Oral Daily  . sodium chloride flush  3 mL Intravenous Q12H  . tamsulosin  0.8 mg Oral Daily   Continuous Infusions: . ceFEPime (MAXIPIME) IV Stopped (10/29/19 0229)  . vancomycin     PRN Meds:.acetaminophen **OR** acetaminophen, albuterol, morphine injection, ondansetron **OR** ondansetron (ZOFRAN) IV, oxyCODONE-acetaminophen  Diet Orders (From admission, onward)    Start     Ordered   10/28/19 1510  Diet NPO time specified  (Undifferentiated presentation (screening labs and basic nursing orders))  Diet effective now        10/28/19 1511          DVT prophylaxis: enoxaparin (LOVENOX) injection 40 mg Start: 10/28/19 1815     Code  Status: DNR  Family Communication: will call sister  Status is: Inpatient  Remains inpatient appropriate because:Inpatient level of care appropriate due to severity of illness   Dispo: The patient is from: Home              Anticipated d/c is to: Home              Anticipated d/c date is: 3 days              Patient currently is not medically stable to d/c.  Consultants:  Critical care  Procedures:  None   Microbiology  SARS-CoV-2-negative Urine cultures pending Blood cultures  pending  Antimicrobials: Vancomycin 9/22 >> Cefepime 9/22 >>   Objective: Vitals:   10/29/19 0345 10/29/19 0400 10/29/19 0415 10/29/19 0747  BP: 109/73 121/75 118/75   Pulse: 87 89 88 85  Resp: 10 (!) 9 (!) 9 20  Temp:      TempSrc:      SpO2: 99% 98% 98% 99%  Weight:      Height:       No intake or output data in the 24 hours ending 10/29/19 0824 Filed Weights   10/28/19 1600  Weight: 97.8 kg    Examination:  Constitutional: No apparent distress, comfortable on BiPAP Eyes: no scleral icterus ENMT: Mucous membranes are moist.  Respiratory: Coarse breath sounds bilaterally, diffuse rhonchi, no wheezing Cardiovascular: Regular rate and rhythm, no murmurs / rubs / gallops. No LE edema. Good peripheral pulses Abdomen: non distended, no tenderness. Bowel sounds positive.  Musculoskeletal: no clubbing / cyanosis.  Skin: no rashes Neurologic: Nonfocal.    Data Reviewed: I have independently reviewed following labs and imaging studies   CBC: Recent Labs  Lab 10/28/19 1510 10/28/19 1537 10/28/19 2314 10/29/19 0336 10/29/19 0748  WBC 14.6*  --   --  13.7*  --   NEUTROABS 12.5*  --   --   --   --   HGB 17.6* 20.4*  20.4* 19.0* 17.5* 19.4*  HCT 58.7* 60.0*  60.0* 56.0* 57.2* 57.0*  MCV 93.5  --   --  95.0  --   PLT 187  --   --  194  --    Basic Metabolic Panel: Recent Labs  Lab 10/28/19 1510 10/28/19 1537 10/28/19 2314 10/29/19 0336 10/29/19 0748  NA 135 138  139 135 137 142  K 4.2 4.2  4.2 3.7 3.7 3.5  CL 95* 96*  --  96*  --   CO2 28  --   --  29  --   GLUCOSE 315* 320*  --  232*  --   BUN 17 25*  --  23  --   CREATININE 1.34* 1.20  --  1.06  --   CALCIUM 8.3*  --   --  8.2*  --    Liver Function Tests: Recent Labs  Lab 10/28/19 1510  AST 28  ALT 16  ALKPHOS 103  BILITOT 1.3*  PROT 8.0  ALBUMIN 3.1*   Coagulation Profile: Recent Labs  Lab 10/28/19 1530  INR 1.3*   HbA1C: Recent Labs    10/28/19 1917  HGBA1C 8.9*   CBG: Recent  Labs  Lab 10/28/19 2346 10/29/19 0809  GLUCAP 291* 220*    Recent Results (from the past 240 hour(s))  SARS Coronavirus 2 by RT PCR (hospital order, performed in [facility] hospital lab) Nasopharyngeal Nasopharyngeal Swab     Status: None   Collection Time: 10/28/19  3:28 PM  Specimen: Nasopharyngeal Swab  Result Value Ref Range Status   SARS Coronavirus 2 NEGATIVE NEGATIVE Final    Comment: (NOTE) SARS-CoV-2 target nucleic acids are NOT DETECTED.  The SARS-CoV-2 RNA is generally detectable in upper and lower respiratory specimens during the acute phase of infection. The lowest concentration of SARS-CoV-2 viral copies this assay can detect is 250 copies / mL. A negative result does not preclude SARS-CoV-2 infection and should not be used as the sole basis for treatment or other patient management decisions.  A negative result may occur with improper specimen collection / handling, submission of specimen other than nasopharyngeal swab, presence of viral mutation(s) within the areas targeted by this assay, and inadequate number of viral copies (<250 copies / mL). A negative result must be combined with clinical observations, patient history, and epidemiological information.  Fact Sheet for Patients:   StrictlyIdeas.no  Fact Sheet for Healthcare Providers: BankingDealers.co.za  This test is not yet approved or  cleared by the [place] FDA and has been authorized for detection and/or diagnosis of SARS-CoV-2 by FDA under an Emergency Use Authorization (EUA).  This EUA will remain in effect (meaning this test can be used) for the duration of the COVID-19 declaration under Section 564(b)(1) of the Act, 21 U.S.C. section 360bbb-3(b)(1), unless the authorization is terminated or revoked sooner.  Performed at [facility], [place] [address], [place], [place] [address]      Radiology Studies: CT CHEST WO CONTRAST  Result  Date: 10/29/2019 CLINICAL DATA:  Cough, altered mental status, respiratory failure EXAM: CT CHEST WITHOUT CONTRAST TECHNIQUE: Multidetector CT imaging of the chest was performed following the standard protocol without IV contrast. COMPARISON:  None FINDINGS: Cardiovascular: Extensive multi-vessel coronary artery calcification. Global cardiac size within normal limits. No pericardial effusion. Central pulmonary arteries are enlarged in keeping with changes of pulmonary arterial hypertension. Thoracic aorta is of normal caliber. Moderate scattered atherosclerotic calcification is seen within the thoracic aorta. Mediastinum/Nodes: No enlarged mediastinal or axillary lymph nodes. Thyroid gland, trachea, and esophagus demonstrate no significant findings. Lungs/Pleura: There is extensive consolidation involving the basilar lingula and left lower lobe as well as airspace infiltrate throughout the aerated left upper lobe in keeping with changes of acute lobar pneumonia. Mild right basilar atelectasis. Benign calcified granuloma within the right mid lung zone. Mild biapical paraseptal emphysema. Linear opacity within the right apex is not well assessed on this examination due to motion artifact. This is best seen on axial image # 44/7. No pneumothorax. No central obstructing mass. Upper Abdomen: Mild elevation of the left hemidiaphragm is present. Limited images of the upper abdomen are otherwise unremarkable. Musculoskeletal: No acute bone abnormality. IMPRESSION: Extensive left lung consolidation in keeping with changes of acute lobar pneumonia. No central obstructing mass. Indeterminate linear opacity within the right apex. This could be reassessed with repeat examination in 3 months once the patient's acute issues have resolved. Extensive coronary artery calcification. Pulmonary arterial hypertension. Aortic Atherosclerosis (ICD10-I70.0) and Emphysema (ICD10-J43.9). Electronically Signed   By: Fidela Salisbury MD   On:  10/29/2019 03:18   DG Chest Port 1 View  Result Date: 10/28/2019 CLINICAL DATA:  Possible sepsis. EXAM: PORTABLE CHEST 1 VIEW COMPARISON:  Chest x-ray dated November 19, 2017. FINDINGS: The patient is rotated to the left. Cardiomegaly. Increased reticulonodular interstitial thickening at the right lung base. Moderate left pleural effusion with dense retrocardiac opacity. No pneumothorax. Unchanged calcified granuloma at the right lung base. No acute osseous abnormality. IMPRESSION: 1. Moderate left  pleural effusion with left basilar atelectasis versus infiltrate. 2. Increased reticulonodular interstitial thickening at the right lung base may be infectious or inflammatory. Electronically Signed   By: Titus Dubin M.D.   On: 10/28/2019 15:40     Marzetta Board, MD, PhD [facility]  Between 7 am - 7 pm I am available, please contact me via Amion or Securechat  Between 7 pm - 7 am I am not available, please contact night coverage MD/APP via Amion

## 2019-10-29 NOTE — ED Notes (Signed)
FiO2 changed per MD to 100%

## 2019-10-29 NOTE — Progress Notes (Signed)
CRITICAL VALUE ALERT  Critical Value:  PCO2 arterial: 73.3 Date & Time Notied:  10/29/19, 1530  Provider Notified: Elvera Lennox  Orders Received/Actions taken: no new orders, continue BiPAP and monitor

## 2019-10-30 DIAGNOSIS — I10 Essential (primary) hypertension: Secondary | ICD-10-CM

## 2019-10-30 DIAGNOSIS — F418 Other specified anxiety disorders: Secondary | ICD-10-CM

## 2019-10-30 DIAGNOSIS — Z6834 Body mass index (BMI) 34.0-34.9, adult: Secondary | ICD-10-CM

## 2019-10-30 DIAGNOSIS — E6609 Other obesity due to excess calories: Secondary | ICD-10-CM

## 2019-10-30 LAB — COMPREHENSIVE METABOLIC PANEL
ALT: 12 U/L (ref 0–44)
AST: 11 U/L — ABNORMAL LOW (ref 15–41)
Albumin: 2.7 g/dL — ABNORMAL LOW (ref 3.5–5.0)
Alkaline Phosphatase: 94 U/L (ref 38–126)
Anion gap: 10 (ref 5–15)
BUN: 20 mg/dL (ref 8–23)
CO2: 30 mmol/L (ref 22–32)
Calcium: 8.3 mg/dL — ABNORMAL LOW (ref 8.9–10.3)
Chloride: 104 mmol/L (ref 98–111)
Creatinine, Ser: 0.92 mg/dL (ref 0.61–1.24)
GFR calc Af Amer: >60 mL/min (ref >60)
GFR calc non Af Amer: >60 mL/min (ref >60)
Glucose, Bld: 163 mg/dL — ABNORMAL HIGH (ref 70–99)
Potassium: 3.5 mmol/L (ref 3.5–5.1)
Sodium: 144 mmol/L (ref 135–145)
Total Bilirubin: 1.5 mg/dL — ABNORMAL HIGH (ref 0.3–1.2)
Total Protein: 7.8 g/dL (ref 6.5–8.1)

## 2019-10-30 LAB — MRSA PCR SCREENING: MRSA by PCR: POSITIVE — AB

## 2019-10-30 LAB — CBC
HCT: 55.6 % — ABNORMAL HIGH (ref 39.0–52.0)
Hemoglobin: 16.4 g/dL (ref 13.0–17.0)
MCH: 28 pg (ref 26.0–34.0)
MCHC: 29.5 g/dL — ABNORMAL LOW (ref 30.0–36.0)
MCV: 94.9 fL (ref 80.0–100.0)
Platelets: 168 10*3/uL (ref 150–400)
RBC: 5.86 MIL/uL — ABNORMAL HIGH (ref 4.22–5.81)
RDW: 13.7 % (ref 11.5–15.5)
WBC: 8.9 10*3/uL (ref 4.0–10.5)
nRBC: 0 % (ref 0.0–0.2)

## 2019-10-30 LAB — LEGIONELLA PNEUMOPHILA SEROGP 1 UR AG: L. pneumophila Serogp 1 Ur Ag: NEGATIVE

## 2019-10-30 LAB — GLUCOSE, CAPILLARY
Glucose-Capillary: 148 mg/dL — ABNORMAL HIGH (ref 70–99)
Glucose-Capillary: 158 mg/dL — ABNORMAL HIGH (ref 70–99)
Glucose-Capillary: 162 mg/dL — ABNORMAL HIGH (ref 70–99)
Glucose-Capillary: 162 mg/dL — ABNORMAL HIGH (ref 70–99)
Glucose-Capillary: 172 mg/dL — ABNORMAL HIGH (ref 70–99)
Glucose-Capillary: 197 mg/dL — ABNORMAL HIGH (ref 70–99)

## 2019-10-30 MED ORDER — POTASSIUM CHLORIDE CRYS ER 20 MEQ PO TBCR
40.0000 meq | EXTENDED_RELEASE_TABLET | Freq: Two times a day (BID) | ORAL | Status: AC
  Administered 2019-10-30 (×2): 40 meq via ORAL
  Filled 2019-10-30 (×2): qty 2

## 2019-10-30 MED ORDER — PIPERACILLIN-TAZOBACTAM 3.375 G IVPB 30 MIN: 3.3750 g | Freq: Three times a day (TID) | INTRAVENOUS | Status: DC

## 2019-10-30 MED ORDER — MUPIROCIN 2 % EX OINT
1.0000 "application " | TOPICAL_OINTMENT | Freq: Two times a day (BID) | CUTANEOUS | Status: AC
  Administered 2019-10-30 – 2019-11-03 (×9): 1 via NASAL
  Filled 2019-10-30: qty 22

## 2019-10-30 MED ORDER — CHLORHEXIDINE GLUCONATE CLOTH 2 % EX PADS
6.0000 | MEDICATED_PAD | Freq: Every day | CUTANEOUS | Status: AC
  Administered 2019-10-31 – 2019-11-04 (×5): 6 via TOPICAL

## 2019-10-30 MED ORDER — FUROSEMIDE 10 MG/ML IJ SOLN
40.0000 mg | Freq: Two times a day (BID) | INTRAMUSCULAR | Status: DC
  Administered 2019-10-30 (×2): 40 mg via INTRAVENOUS
  Filled 2019-10-30 (×2): qty 4

## 2019-10-30 MED ORDER — PIPERACILLIN-TAZOBACTAM 3.375 G IVPB
3.3750 g | Freq: Three times a day (TID) | INTRAVENOUS | Status: DC
  Administered 2019-10-30 – 2019-10-31 (×3): 3.375 g via INTRAVENOUS
  Filled 2019-10-30 (×4): qty 50

## 2019-10-30 NOTE — Progress Notes (Signed)
NAME:  Sergio Murphy, MRN:  [id number], DOB:  [birth year], LOS: 2 ADMISSION DATE:  10/28/2019, CONSULTATION DATE:  07/28/2019 REFERRING MD:  Jonah Blue, MD, CHIEF COMPLAINT:  SOB  Brief History   64 year old male with history of cerebral palsy coming in from assisted living with shortness of breath cough and altered mental status for 1 day.  X-ray chest suggestive of multifocal pneumonia left-sided effusion.  Requiring nonrebreather mask oxygen.  Critical care was consulted for evaluation of hypoxia History of present illness   64 year old male with history of cerebral palsy who was brought into the emergency department from assisted living with complaint of shortness of breath cough and altered mental status starting yesterday.  As per patient he was in his usual state of health until yesterday when he started getting cough and shortness of breath, it got worse and last 24 hours, so EMS was called in.  Upon EMS arrival patient was noted to have O2 sat in 50s, he was put on nonrebreather with improvement in oxygenation.  Past Medical History  Cerebral palsy Obesity Ex-smoker Diabetes type 2 Hypertension Seizure disorder Significant Hospital Events   Came to the hospital  Consults:  PCCM  Procedures:  N/A  Significant Diagnostic Tests:    Micro Data:  9/22 Covid negative 9/22 blood culture >> 9/22 urine culture >>  Antimicrobials:  9/22 vancomycin 9/22 cefepime  Interim history/subjective:  Patient was placed on nonrebreather, he was given IV fluid and IV antibiotic therapy with improvement in oxygenation up to 95% on nonrebreather.  He is much more alert and awake.  Objective   Blood pressure 119/60, pulse 89, temperature 98.4 F (36.9 C), temperature source Axillary, resp. rate 20, height 5\' 6"  (1.676 m), weight 100.7 kg, SpO2 94 %.    FiO2 (%):  [50 %-60 %] 50 %   Intake/Output Summary (Last 24 hours) at 10/30/2019 1059 Last data filed at 10/29/2019 2100 Gross per  24 hour  Intake 364.28 ml  Output 1425 ml  Net -1060.72 ml   Filed Weights   10/28/19 1600 10/30/19 0450  Weight: 97.8 kg 100.7 kg    Examination:   Physical exam: General: Chronically ill-appearing obese male, on nonrebreather HEAENT: [place]/AT, eyes anicteric.    Dry mucous membrane Neuro: Alert, awake, following commands. Chest: Reduced air entry at left base, coarse crackles heard on the right side, no wheezes or rhonchi Heart: Regular rate and rhythm, no murmurs or gallops Abdomen: Soft, nontender, nondistended, bowel sounds present Skin: No rash   Resolved Hospital Problem list   N/A  Assessment & Plan:  Acute hypoxic/hypercapnic respiratory failure due to multifocal pneumonia Sepsis due to multifocal pneumonia Left-sided pleural effusion Cerebral palsy Acute metabolic encephalopathy Seizure disorder  Currently has been on noninvasive mechanical ventilatory support for 48 hours 50% FiO2 Dense pneumonia on radiographic Poor mucociliary clearance Discussion with him and his sister today he is amenable to endovascular tracheostomy.  He has had a tracheostomy in the past he got 100 DNI DNR Tylenol what we can offer from  Best practice:  Diet: NPO Pain/Anxiety/Delirium protocol (if indicated): Per primary team VAP protocol (if indicated): N/A DVT prophylaxis: Subcu heparin GI prophylaxis: Famotidine Glucose control: Sliding scale insulin as needed Mobility: As tolerated Code Status: DNR 10/30/2019 high flow DN none to date intermittent Family Communication: 10/30/2019 patient and sister updated at bedside Disposition: Patient can be admitted to progressive care unit  Labs   CBC: Recent Labs  Lab 10/28/19 1510 10/28/19 1510 10/28/19 1537  10/28/19 2314 10/29/19 0336 10/29/19 0748 10/30/19 0707  WBC 14.6*  --   --   --  13.7*  --  8.9  NEUTROABS 12.5*  --   --   --   --   --   --   HGB 17.6*   < > 20.4*  20.4* 19.0* 17.5* 19.4* 16.4  HCT 58.7*   < > 60.0*   60.0* 56.0* 57.2* 57.0* 55.6*  MCV 93.5  --   --   --  95.0  --  94.9  PLT 187  --   --   --  194  --  168   < > = values in this interval not displayed.    Basic Metabolic Panel: Recent Labs  Lab 10/28/19 1510 10/28/19 1510 10/28/19 1537 10/28/19 2314 10/29/19 0336 10/29/19 0748 10/30/19 0707  NA 135   < > 138  139 135 137 142 144  K 4.2   < > 4.2  4.2 3.7 3.7 3.5 3.5  CL 95*  --  96*  --  96*  --  104  CO2 28  --   --   --  29  --  30  GLUCOSE 315*  --  320*  --  232*  --  163*  BUN 17  --  25*  --  23  --  20  CREATININE 1.34*  --  1.20  --  1.06  --  0.92  CALCIUM 8.3*  --   --   --  8.2*  --  8.3*   < > = values in this interval not displayed.   GFR: Estimated Creatinine Clearance: 90.2 mL/min (by C-G formula based on SCr of 0.92 mg/dL). Recent Labs  Lab 10/28/19 1510 10/28/19 1710 10/28/19 1917 10/29/19 0336 10/30/19 0707  PROCALCITON  --   --  2.18  --   --   WBC 14.6*  --   --  13.7* 8.9  LATICACIDVEN 2.1* 1.4  --   --   --     Liver Function Tests: Recent Labs  Lab 10/28/19 1510 10/30/19 0707  AST 28 11*  ALT 16 12  ALKPHOS 103 94  BILITOT 1.3* 1.5*  PROT 8.0 7.8  ALBUMIN 3.1* 2.7*   No results for input(s): LIPASE, AMYLASE in the last 168 hours. No results for input(s): AMMONIA in the last 168 hours.  ABG    Component Value Date/Time   PHART 7.272 (L) 10/29/2019 1525   PCO2ART 73.3 (HH) 10/29/2019 1525   PO2ART 99.2 10/29/2019 1525   HCO3 32.8 (H) 10/29/2019 1525   TCO2 37 (H) 10/29/2019 0748   O2SAT 97.4 10/29/2019 1525     Coagulation Profile: Recent Labs  Lab 10/28/19 1530  INR 1.3*    Cardiac Enzymes: No results for input(s): CKTOTAL, CKMB, CKMBINDEX, TROPONINI in the last 168 hours.  HbA1C: Hgb A1c MFr Bld  Date/Time Value Ref Range Status  10/28/2019 07:17 PM 8.9 (H) 4.8 - 5.6 % Final    Comment:    (NOTE) Pre diabetes:          5.7%-6.4%  Diabetes:              >6.4%  Glycemic control for   <7.0% adults with  diabetes   11/14/2017 05:51 AM 8.1 (H) 4.8 - 5.6 % Final    Comment:    (NOTE) Pre diabetes:          5.7%-6.4% Diabetes:              >  6.4% Glycemic control for   <7.0% adults with diabetes     CBG: Recent Labs  Lab 10/29/19 1730 10/29/19 2140 10/30/19 0049 10/30/19 0419 10/30/19 0839  GLUCAP 150* 169* 162* 148* 172*      Steve Aika Brzoska ACNP Acute Care Nurse Practitioner [facility]/Critical Care Please consult Amion 10/30/2019, 11:01 AM

## 2019-10-30 NOTE — Progress Notes (Addendum)
[facility] PROGRESS NOTE    Progress Note  Sergio Murphy  LSL:[id number] DOB: [birth year] DOA: 10/28/2019 PCP: Shayne Alken, MD     Brief Narrative:   Sergio Murphy is an 64 y.o. male medical history of cerebral palsy seizure disorder chronic trach dependent respiratory failure discontinued 2 years ago and prior PEG tube, essential hypertension diabetes mellitus type 2 comes into the hospital for shortness of breath over the last several days found to have healthcare associated pneumonia, in the ED ED he required a BiPAP and PCCM was consulted.  Assessment/Plan:   Sepsis due to pneumonia (HCC)/acute respiratory failure with hypoxia and hypercarbia: Sepsis has been ruled in. Currently on BiPAP 7.20 /73/99 on BiPAP with an FiO2 of 50%. He on broad-spectrum antibiotics IV vancomycin and cefepime, culture data has remained negative till date. We will start IV Lasix as he has some peripheral edema to see if it helps with respiratory status.    Cerebral palsy: Continue current home meds previously had a trach and PEG was DC'd about 3 years ago.  Chronic pain disorder: Continue current regimen.  Essential hypertension: Blood pressure stable holding Lasix.  History of seizure disorder: None in house continue Keppra.  Diabetes mellitus type 2: His last A1c 9, fairly controlled continue sliding scale insulin.  Depression with anxiety: Continue Zoloft and Valium.  Obesity: Counseling BMI of 35  DNR (do not resuscitate) Admitting MD discussed CODE STATUS with patient and sister they would not like a repeated tracheostomy and would not like to be resuscitated.   DVT prophylaxis: lovenxo Family Communication:Sister Status is: Inpatient  Remains inpatient appropriate because:Hemodynamically unstable   Dispo: The patient is from: Home              Anticipated d/c is to: SNF              Anticipated d/c date is: 3 days              Patient currently is not  medically stable to d/c.        Code Status:     Code Status Orders  (From admission, onward)         Start     Ordered   10/28/19 1813  Do not attempt resuscitation (DNR)  Continuous       Question Answer Comment  In the event of cardiac or respiratory ARREST Do not call a "code blue"   In the event of cardiac or respiratory ARREST Do not perform Intubation, CPR, defibrillation or ACLS   In the event of cardiac or respiratory ARREST Use medication by any route, position, wound care, and other measures to relive pain and suffering. May use oxygen, suction and manual treatment of airway obstruction as needed for comfort.      10/28/19 1814        Code Status History    This patient has a current code status but no historical code status.   Advance Care Planning Activity        IV Access:    Peripheral IV   Procedures and diagnostic studies:   CT CHEST WO CONTRAST  Result Date: 10/29/2019 CLINICAL DATA:  Cough, altered mental status, respiratory failure EXAM: CT CHEST WITHOUT CONTRAST TECHNIQUE: Multidetector CT imaging of the chest was performed following the standard protocol without IV contrast. COMPARISON:  None FINDINGS: Cardiovascular: Extensive multi-vessel coronary artery calcification. Global cardiac size within normal limits. No pericardial effusion. Central pulmonary arteries are enlarged in  keeping with changes of pulmonary arterial hypertension. Thoracic aorta is of normal caliber. Moderate scattered atherosclerotic calcification is seen within the thoracic aorta. Mediastinum/Nodes: No enlarged mediastinal or axillary lymph nodes. Thyroid gland, trachea, and esophagus demonstrate no significant findings. Lungs/Pleura: There is extensive consolidation involving the basilar lingula and left lower lobe as well as airspace infiltrate throughout the aerated left upper lobe in keeping with changes of acute lobar pneumonia. Mild right basilar atelectasis. Benign  calcified granuloma within the right mid lung zone. Mild biapical paraseptal emphysema. Linear opacity within the right apex is not well assessed on this examination due to motion artifact. This is best seen on axial image # 44/7. No pneumothorax. No central obstructing mass. Upper Abdomen: Mild elevation of the left hemidiaphragm is present. Limited images of the upper abdomen are otherwise unremarkable. Musculoskeletal: No acute bone abnormality. IMPRESSION: Extensive left lung consolidation in keeping with changes of acute lobar pneumonia. No central obstructing mass. Indeterminate linear opacity within the right apex. This could be reassessed with repeat examination in 3 months once the patient's acute issues have resolved. Extensive coronary artery calcification. Pulmonary arterial hypertension. Aortic Atherosclerosis (ICD10-I70.0) and Emphysema (ICD10-J43.9). Electronically Signed   By: Helyn Numbers MD   On: 10/29/2019 03:18   DG Chest Port 1 View  Result Date: 10/28/2019 CLINICAL DATA:  Possible sepsis. EXAM: PORTABLE CHEST 1 VIEW COMPARISON:  Chest x-ray dated November 19, 2017. FINDINGS: The patient is rotated to the left. Cardiomegaly. Increased reticulonodular interstitial thickening at the right lung base. Moderate left pleural effusion with dense retrocardiac opacity. No pneumothorax. Unchanged calcified granuloma at the right lung base. No acute osseous abnormality. IMPRESSION: 1. Moderate left pleural effusion with left basilar atelectasis versus infiltrate. 2. Increased reticulonodular interstitial thickening at the right lung base may be infectious or inflammatory. Electronically Signed   By: Obie Dredge M.D.   On: 10/28/2019 15:40     Medical Consultants:    None.  Anti-Infectives:   Vancomycin and cefepime for 7 days.  Subjective:    Sergio Murphy relates his breathing is not improved.  Objective:    Vitals:   10/30/19 0450 10/30/19 0532 10/30/19 0841 10/30/19 0908   BP: 120/64  119/60   Pulse: 87 84 89   Resp: Temp: 98.7 F (37.1 C)  98.4 F (36.9 C)   TempSrc: Axillary  Axillary   SpO2: 96% 96% 95% 94%  Weight: 100.7 kg     Height:       SpO2: 94 % O2 Flow Rate (L/min): 15 L/min FiO2 (%): 50 %   Intake/Output Summary (Last 24 hours) at 10/30/2019 0925 Last data filed at 10/29/2019 2100 Gross per 24 hour  Intake 364.28 ml  Output 1425 ml  Net -1060.72 ml   Filed Weights   10/28/19 1600 10/30/19 0450  Weight: 97.8 kg 100.7 kg    Exam: General exam: In no acute distress. Respiratory system: Good air movement with rhonchi bilaterally. Cardiovascular system: S1 & S2 heard, RRR. No JVD. Gastrointestinal system: Abdomen is nondistended, soft and nontender.  Extremities: No pedal edema. Skin: No rashes, lesions or ulcers Psychiatry: Judgement and insight appear normal. Mood & affect appropriate.    Data Reviewed:    Labs: Basic Metabolic Panel: Recent Labs  Lab 10/28/19 1510 10/28/19 1510 10/28/19 1537 10/28/19 1537 10/28/19 2314 10/28/19 2314 10/29/19 0336 10/29/19 0336 10/29/19 0748 10/30/19 0707  NA 135   < > 138  139  --  135  --  137  --  142 144  K 4.2   < > 4.2  4.2   < > 3.7   < > 3.7   < > 3.5 3.5  CL 95*  --  96*  --   --   --  96*  --   --  104  CO2 28  --   --   --   --   --  29  --   --  30  GLUCOSE 315*  --  320*  --   --   --  232*  --   --  163*  BUN 17  --  25*  --   --   --  23  --   --  20  CREATININE 1.34*  --  1.20  --   --   --  1.06  --   --  0.92  CALCIUM 8.3*  --   --   --   --   --  8.2*  --   --  8.3*   < > = values in this interval not displayed.   GFR Estimated Creatinine Clearance: 90.2 mL/min (by C-G formula based on SCr of 0.92 mg/dL). Liver Function Tests: Recent Labs  Lab 10/28/19 1510 10/30/19 0707  AST 28 11*  ALT 16 12  ALKPHOS 103 94  BILITOT 1.3* 1.5*  PROT 8.0 7.8  ALBUMIN 3.1* 2.7*   No results for input(s): LIPASE, AMYLASE in the last 168 hours. No  results for input(s): AMMONIA in the last 168 hours. Coagulation profile Recent Labs  Lab 10/28/19 1530  INR 1.3*   COVID-19 Labs  Recent Labs    10/28/19 1530  DDIMER 0.57*    Lab Results  Component Value Date   SARSCOV2NAA NEGATIVE 10/28/2019    CBC: Recent Labs  Lab 10/28/19 1510 10/28/19 1510 10/28/19 1537 10/28/19 2314 10/29/19 0336 10/29/19 0748 10/30/19 0707  WBC 14.6*  --   --   --  13.7*  --  8.9  NEUTROABS 12.5*  --   --   --   --   --   --   HGB 17.6*   < > 20.4*  20.4* 19.0* 17.5* 19.4* 16.4  HCT 58.7*   < > 60.0*  60.0* 56.0* 57.2* 57.0* 55.6*  MCV 93.5  --   --   --  95.0  --  94.9  PLT 187  --   --   --  194  --  168   < > = values in this interval not displayed.   Cardiac Enzymes: No results for input(s): CKTOTAL, CKMB, CKMBINDEX, TROPONINI in the last 168 hours. BNP (last 3 results) No results for input(s): PROBNP in the last 8760 hours. CBG: Recent Labs  Lab 10/29/19 1730 10/29/19 2140 10/30/19 0049 10/30/19 0419 10/30/19 0839  GLUCAP 150* 169* 162* 148* 172*   D-Dimer: Recent Labs    10/28/19 1530  DDIMER 0.57*   Hgb A1c: Recent Labs    10/28/19 1917  HGBA1C 8.9*   Lipid Profile: No results for input(s): CHOL, HDL, LDLCALC, TRIG, CHOLHDL, LDLDIRECT in the last 72 hours. Thyroid function studies: No results for input(s): TSH, T4TOTAL, T3FREE, THYROIDAB in the last 72 hours.  Invalid input(s): FREET3 Anemia work up: No results for input(s): VITAMINB12, FOLATE, FERRITIN, TIBC, IRON, RETICCTPCT in the last 72 hours. Sepsis Labs: Recent Labs  Lab 10/28/19 1510 10/28/19 1710 10/28/19 1917 10/29/19 0336 10/30/19 0707  PROCALCITON  --   --  2.18  --   --   WBC 14.6*  --   --  13.7* 8.9  LATICACIDVEN 2.1* 1.4  --   --   --    Microbiology Recent Results (from the past 240 hour(s))  Blood culture (routine single)     Status: None (Preliminary result)   Collection Time: 10/28/19  3:10 PM   Specimen: BLOOD RIGHT FOREARM   Result Value Ref Range Status   Specimen Description BLOOD RIGHT FOREARM  Final   Special Requests   Final    BOTTLES DRAWN AEROBIC AND ANAEROBIC Blood Culture adequate volume   Culture   Final    NO GROWTH < 24 HOURS Performed at [facility], [address], [place], [place] [address]    Report Status PENDING  Incomplete  SARS Coronavirus 2 by RT PCR (hospital order, performed in [facility] hospital lab) Nasopharyngeal Nasopharyngeal Swab     Status: None   Collection Time: 10/28/19  3:28 PM   Specimen: Nasopharyngeal Swab  Result Value Ref Range Status   SARS Coronavirus 2 NEGATIVE NEGATIVE Final    Comment: (NOTE) SARS-CoV-2 target nucleic acids are NOT DETECTED.  The SARS-CoV-2 RNA is generally detectable in upper and lower respiratory specimens during the acute phase of infection. The lowest concentration of SARS-CoV-2 viral copies this assay can detect is 250 copies / mL. A negative result does not preclude SARS-CoV-2 infection and should not be used as the sole basis for treatment or other patient management decisions.  A negative result may occur with improper specimen collection / handling, submission of specimen other than nasopharyngeal swab, presence of viral mutation(s) within the areas targeted by this assay, and inadequate number of viral copies (<250 copies / mL). A negative result must be combined with clinical observations, patient history, and epidemiological information.  Fact Sheet for Patients:   BoilerBrush.com.cy  Fact Sheet for Healthcare Providers: https://pope.com/  This test is not yet approved or  cleared by the [place] FDA and has been authorized for detection and/or diagnosis of SARS-CoV-2 by FDA under an Emergency Use Authorization (EUA).  This EUA will remain in effect (meaning this test can be used) for the duration of the COVID-19 declaration under Section 564(b)(1) of the Act, 21  U.S.C. section 360bbb-3(b)(1), unless the authorization is terminated or revoked sooner.  Performed at [facility], [address], [place], [place] [address]   Culture, blood (single)     Status: None (Preliminary result)   Collection Time: 10/28/19  4:46 PM   Specimen: BLOOD LEFT WRIST  Result Value Ref Range Status   Specimen Description BLOOD LEFT WRIST  Final   Special Requests   Final    BOTTLES DRAWN AEROBIC AND ANAEROBIC Blood Culture adequate volume   Culture   Final    NO GROWTH < 24 HOURS Performed at [facility], [address] [place], [place] [address]    Report Status PENDING  Incomplete  Urine culture     Status: Abnormal   Collection Time: 10/29/19  1:40 AM   Specimen: In/Out Cath Urine  Result Value Ref Range Status   Specimen Description IN/OUT CATH URINE  Final   Special Requests   Final    NONE Performed at [facility] Lab, [address], [place], [place] [address]    Culture MULTIPLE SPECIES PRESENT, SUGGEST RECOLLECTION (A)  Final   Report Status 10/29/2019 FINAL  Final     Medications:   .  acidophilus  2 capsule Oral Daily  . amLODipine  5 mg Oral Daily  . aspirin  81 mg Oral Daily  . baclofen  10 mg Oral TID  . diazepam  5 mg Oral TID  . docusate sodium  100 mg Oral BID  . enoxaparin (LOVENOX) injection  40 mg Subcutaneous Q24H  . famotidine  20 mg Oral BID  . insulin aspart  0-15 Units Subcutaneous TID WC  . insulin aspart  0-5 Units Subcutaneous QHS  . naloxegol oxalate  25 mg Oral Daily  . oxyCODONE-acetaminophen  1 tablet Oral Q6H   And  . oxyCODONE  5 mg Oral Q6H  . pregabalin  150 mg Oral Q12H  . sertraline  75 mg Oral Daily  . sodium chloride flush  3 mL Intravenous Q12H  . tamsulosin  0.8 mg Oral Daily   Continuous Infusions: . ceFEPime (MAXIPIME) IV 2 g (10/30/19 0802)  . levETIRAcetam 500 mg (10/29/19 1749)  . vancomycin 1,250 mg (10/29/19 1719)      LOS: 2 days   Marinda ElkAbraham Feliz Ortiz  [facility]  10/30/2019, 9:25 AM

## 2019-10-30 NOTE — Progress Notes (Signed)
NAME:  Sergio Murphy, MRN:  [id number], DOB:  28-Mar-1955, LOS: 2 ADMISSION DATE:  10/28/2019, CONSULTATION DATE:  07/28/2019 REFERRING MD:  Jonah Blue, MD, CHIEF COMPLAINT:  SOB  Brief History   64 year old male with history of cerebral palsy coming in from assisted living with shortness of breath cough and altered mental status for 1 day.  X-ray chest suggestive of multifocal pneumonia left-sided effusion.  Requiring nonrebreather mask oxygen.  Critical care was consulted for evaluation of hypoxia  Past Medical History  Cerebral palsy Obesity Ex-smoker Diabetes type 2 Hypertension Seizure disorder Significant Hospital Events   Came to the hospital  Consults:  PCCM  Procedures:  N/A  Significant Diagnostic Tests:    Micro Data:  9/22 Covid negative 9/22 blood culture >> negative 9/22 urine culture >> multiple species  Antimicrobials:  9/22 vancomycin >> 9/22 cefepime >> 9/24 9/24 Zosyn >>  Interim history/subjective:  Patient remained on BiPAP for the last 2 days, with O2 sat in 90s, he is calm comfortable and engaging in conversation.  Objective   Blood pressure 119/60, pulse 89, temperature 98.4 F (36.9 C), temperature source Axillary, resp. rate 20, height 5\' 6"  (1.676 m), weight 100.7 kg, SpO2 92 %.    FiO2 (%):  [50 %-60 %] 50 %   Intake/Output Summary (Last 24 hours) at 10/30/2019 1119 Last data filed at 10/29/2019 2100 Gross per 24 hour  Intake 364.28 ml  Output 1425 ml  Net -1060.72 ml   Filed Weights   10/28/19 1600 10/30/19 0450  Weight: 97.8 kg 100.7 kg    Examination:   Physical exam: General: Chronically ill-appearing obese male, on BiPAP HEAENT: [place]/AT, eyes anicteric.  Severely dry mucous membrane Neuro: Alert, awake, following commands. Chest: Coarse crackles heard at left base, fine crackles heard on the right side, no wheezes or rhonchi Heart: Regular rate and rhythm, no murmurs or gallops Abdomen: Soft, nontender, nondistended,  bowel sounds present Skin: No rash   Resolved Hospital Problem list   N/A  Assessment & Plan:  Acute hypoxic/hypercapnic respiratory failure due to multifocal pneumonia Sepsis (POA) due to multifocal pneumonia, likely aspiration Cerebral palsy Acute metabolic encephalopathy Seizure disorder  Patient was placed on BiPAP for last 2 days, would recommend switching it to high flow nasal cannula and alternating with BiPAP at night only, in this way he will be able to eat Closely monitor O2 sat and trend ABGs Continue vancomycin, switch cefepime to Zosyn to have proper coverage for anaerobes along with gram-negative likely due to aspiration Send respiratory culture and MRSA screen, if MRSA screen is negative then would recommend stopping vancomycin Send urine Legionella and pneumococcal antigen Continue maintenance IV fluid, considering he looks dehydrated Continue Keppra Continue insulin with fingerstick goal of 140-180 Hold antihypertensives for now   I had extensive discussion with patient and his sister, he do not want to be resuscitated or intubated.  He wants to try noninvasive ventilation. Best practice:  Diet: Per primary team Pain/Anxiety/Delirium protocol (if indicated): Per primary team VAP protocol (if indicated): N/A DVT prophylaxis: Subcu heparin GI prophylaxis: Famotidine Glucose control: Sliding scale insulin Mobility: As tolerated Code Status: DNR Family Communication: Patient sister was updated at bedside Disposition: Patient can be admitted to progressive care unit  Labs   CBC: Recent Labs  Lab 10/28/19 1510 10/28/19 1510 10/28/19 1537 10/28/19 2314 10/29/19 0336 10/29/19 0748 10/30/19 0707  WBC 14.6*  --   --   --  13.7*  --  8.9  NEUTROABS  12.5*  --   --   --   --   --   --   HGB 17.6*   < > 20.4*  20.4* 19.0* 17.5* 19.4* 16.4  HCT 58.7*   < > 60.0*  60.0* 56.0* 57.2* 57.0* 55.6*  MCV 93.5  --   --   --  95.0  --  94.9  PLT 187  --   --   --  194   --  168   < > = values in this interval not displayed.    Basic Metabolic Panel: Recent Labs  Lab 10/28/19 1510 10/28/19 1510 10/28/19 1537 10/28/19 2314 10/29/19 0336 10/29/19 0748 10/30/19 0707  NA 135   < > 138  139 135 137 142 144  K 4.2   < > 4.2  4.2 3.7 3.7 3.5 3.5  CL 95*  --  96*  --  96*  --  104  CO2 28  --   --   --  29  --  30  GLUCOSE 315*  --  320*  --  232*  --  163*  BUN 17  --  25*  --  23  --  20  CREATININE 1.34*  --  1.20  --  1.06  --  0.92  CALCIUM 8.3*  --   --   --  8.2*  --  8.3*   < > = values in this interval not displayed.   GFR: Estimated Creatinine Clearance: 90.2 mL/min (by C-G formula based on SCr of 0.92 mg/dL). Recent Labs  Lab 10/28/19 1510 10/28/19 1710 10/28/19 1917 10/29/19 0336 10/30/19 0707  PROCALCITON  --   --  2.18  --   --   WBC 14.6*  --   --  13.7* 8.9  LATICACIDVEN 2.1* 1.4  --   --   --     Liver Function Tests: Recent Labs  Lab 10/28/19 1510 10/30/19 0707  AST 28 11*  ALT 16 12  ALKPHOS 103 94  BILITOT 1.3* 1.5*  PROT 8.0 7.8  ALBUMIN 3.1* 2.7*   No results for input(s): LIPASE, AMYLASE in the last 168 hours. No results for input(s): AMMONIA in the last 168 hours.  ABG    Component Value Date/Time   PHART 7.272 (L) 10/29/2019 1525   PCO2ART 73.3 (HH) 10/29/2019 1525   PO2ART 99.2 10/29/2019 1525   HCO3 32.8 (H) 10/29/2019 1525   TCO2 37 (H) 10/29/2019 0748   O2SAT 97.4 10/29/2019 1525     Coagulation Profile: Recent Labs  Lab 10/28/19 1530  INR 1.3*    Cardiac Enzymes: No results for input(s): CKTOTAL, CKMB, CKMBINDEX, TROPONINI in the last 168 hours.  HbA1C: Hgb A1c MFr Bld  Date/Time Value Ref Range Status  10/28/2019 07:17 PM 8.9 (H) 4.8 - 5.6 % Final    Comment:    (NOTE) Pre diabetes:          5.7%-6.4%  Diabetes:              >6.4%  Glycemic control for   <7.0% adults with diabetes   11/14/2017 05:51 AM 8.1 (H) 4.8 - 5.6 % Final    Comment:    (NOTE) Pre diabetes:           5.7%-6.4% Diabetes:              >6.4% Glycemic control for   <7.0% adults with diabetes     CBG: Recent Labs  Lab 10/29/19 1730 10/29/19  2140 10/30/19 0049 10/30/19 0419 10/30/19 0839  GLUCAP 150* 169* 162* 148* 172*    Review of Systems:   Positive for shortness of breath, cough, fever, constipation and lethargy. Rest of the 12 point review of system is negative  Past Medical History  He,  has a past medical history of Cerebral palsy, quadriplegic (HCC), Chronic pain disorder, Class 1 obesity with body mass index (BMI) of 34.0 to 34.9 in adult, Depression with anxiety, Diabetes mellitus type 2 in obese [facility]), DNR (do not resuscitate), Essential hypertension, and Seizure disorder (HCC).   Surgical History    Past Surgical History:  Procedure Laterality Date  . IR GASTROSTOMY TUBE REMOVAL  01/16/2018     Social History   reports that he has quit smoking. He has never used smokeless tobacco. He reports previous alcohol use. He reports previous drug use.   Family History   His family history is not on file.   Allergies Allergies  Allergen Reactions  . Latex Swelling  . Benazepril Other (See Comments)    AKI and hyperkalemia  . Carbamazepine Other (See Comments)    Reaction not listed on MAR     Home Medications  Prior to Admission medications   Medication Sig Start Date End Date Taking? Authorizing Provider  amLODipine (NORVASC) 5 MG tablet Take 5 mg by mouth daily. 10/20/19   [provider]  Baclofen 5 MG TABS  10/19/19   [provider]  diazepam (VALIUM) 5 MG tablet Take 5 mg by mouth 3 (three) times daily as needed. 10/23/19   [provider]  famotidine (PEPCID) 40 MG tablet  07/20/19   [provider]  furosemide (LASIX) 20 MG tablet Take 20 mg by mouth 2 (two) times daily. 10/13/19   [provider]  levETIRAcetam (KEPPRA) 500 MG tablet  07/24/19   [provider]  oxyCODONE-acetaminophen (PERCOCET)  10-325 MG tablet  07/23/19   [provider]  potassium chloride SA (KLOR-CON) 20 MEQ tablet  07/21/19   [provider]  pregabalin (LYRICA) 150 MG capsule  07/11/19   [provider]  sodium chloride 0.9 % infusion Inject into the vein. 10/28/19   [provider]  tamsulosin (FLOMAX) 0.4 MG CAPS capsule  07/14/19   [provider]  traZODone (DESYREL) 50 MG tablet  05/07/19   [provider]  TRULICITY 0.75 MG/0.5ML SOPN SMARTSIG:0.5 Milliliter(s) SUB-Q Once a Week 10/24/19   [provider]     Total critical care time: 35 minutes  Performed by: Cheri Fowler   Critical care time was exclusive of separately billable procedures and treating other patients.   Critical care was necessary to treat or prevent imminent or life-threatening deterioration.   Critical care was time spent personally by me on the following activities: development of treatment plan with patient and/or surrogate as well as nursing, discussions with consultants, evaluation of patient's response to treatment, examination of patient, obtaining history from patient or surrogate, ordering and performing treatments and interventions, ordering and review of laboratory studies, ordering and review of radiographic studies, pulse oximetry and re-evaluation of patient's condition.   Cheri Fowler MD Critical care physician [facility] [place] [facility]  Pager: [phone] Mobile: [phone]

## 2019-10-31 LAB — BASIC METABOLIC PANEL
Anion gap: 13 (ref 5–15)
BUN: 19 mg/dL (ref 8–23)
CO2: 32 mmol/L (ref 22–32)
Calcium: 8.5 mg/dL — ABNORMAL LOW (ref 8.9–10.3)
Chloride: 97 mmol/L — ABNORMAL LOW (ref 98–111)
Creatinine, Ser: 0.88 mg/dL (ref 0.61–1.24)
GFR calc Af Amer: >60 mL/min (ref >60)
GFR calc non Af Amer: >60 mL/min (ref >60)
Glucose, Bld: 220 mg/dL — ABNORMAL HIGH (ref 70–99)
Potassium: 3.6 mmol/L (ref 3.5–5.1)
Sodium: 142 mmol/L (ref 135–145)

## 2019-10-31 LAB — GLUCOSE, CAPILLARY
Glucose-Capillary: 303 mg/dL — ABNORMAL HIGH (ref 70–99)
Glucose-Capillary: 215 mg/dL — ABNORMAL HIGH (ref 70–99)
Glucose-Capillary: 112 mg/dL — ABNORMAL HIGH (ref 70–99)
Glucose-Capillary: 214 mg/dL — ABNORMAL HIGH (ref 70–99)

## 2019-10-31 MED ORDER — SODIUM CHLORIDE 0.9 % IV SOLN
2.0000 g | Freq: Three times a day (TID) | INTRAVENOUS | Status: DC
  Administered 2019-10-31 – 2019-11-02 (×7): 2 g via INTRAVENOUS
  Filled 2019-10-31 (×7): qty 2

## 2019-10-31 MED ORDER — FUROSEMIDE 10 MG/ML IJ SOLN
40.0000 mg | Freq: Two times a day (BID) | INTRAMUSCULAR | Status: AC
  Administered 2019-10-31 (×2): 40 mg via INTRAVENOUS
  Filled 2019-10-31 (×2): qty 4

## 2019-10-31 MED ORDER — GUAIFENESIN 100 MG/5ML PO SOLN
5.0000 mL | ORAL | Status: DC | PRN
  Administered 2019-10-31 – 2019-11-02 (×3): 100 mg via ORAL
  Filled 2019-10-31 (×3): qty 5

## 2019-10-31 MED ORDER — INSULIN GLARGINE 100 UNIT/ML ~~LOC~~ SOLN
5.0000 [IU] | Freq: Every day | SUBCUTANEOUS | Status: DC
  Administered 2019-10-31 – 2019-11-04 (×5): 5 [IU] via SUBCUTANEOUS
  Filled 2019-10-31 (×6): qty 0.05

## 2019-10-31 NOTE — Progress Notes (Signed)
CRITICAL VALUE ALERT  Critical Value:  pco2 66.4  Date & Time Notied:  10/31/19 2354 Provider Notified: Dr. Antionette Char  Orders Received/Actions taken: patient placed back on bipap.   Adair Patter, RN

## 2019-10-31 NOTE — Progress Notes (Signed)
[facility] PROGRESS NOTE    Progress Note  DSHAWN MCNAY  JOA:[id number] DOB: [birth year] DOA: 10/28/2019 PCP: Shayne Alken, MD     Brief Narrative:   Sergio Murphy is an 64 y.o. male medical history of cerebral palsy seizure disorder chronic trach dependent respiratory failure discontinued 2 years ago and prior PEG tube, essential hypertension diabetes mellitus type 2 comes into the hospital for shortness of breath over the last several days found to have healthcare associated pneumonia, in the ED ED he required a BiPAP and PCCM was consulted.  Assessment/Plan:   Sepsis due to pneumonia (HCC)/acute respiratory failure with hypoxia and hypercarbia: Sepsis has been ruled in. Continue to wean as much as possible he is currently on high flow nasal cannula 9 L.  Continue IV vancomycin and cefepime culture data has remained negative.  Febrile leukocytosis is resolved. Continue IV Lasix.  Blood pressure is holding. Basic metabolic panel is pending. Out of bed to chair.  P.o. IV fluids.  Cerebral palsy: Continue current home meds previously had a trach and PEG was DC'd about 3 years ago.  Chronic pain disorder: Continue current regimen.  Essential hypertension: Blood pressure holding steady continue Lasix.  History of seizure disorder: None in house continue Keppra.  Diabetes mellitus type 2: His last A1c 9, he will need to be started on a oral hypoglycemic agent as an outpatient. We will start him on low-dose long-acting insulin.  Depression with anxiety: Continue Zoloft and Valium.  Obesity: Counseling BMI of 35  DNR (do not resuscitate) Admitting MD discussed CODE STATUS with patient and sister they would not like a repeated tracheostomy and would not like to be resuscitated.   DVT prophylaxis: lovenxo Family Communication:Sister Status is: Inpatient  Remains inpatient appropriate because:Hemodynamically unstable   Dispo: The patient is from:  Home              Anticipated d/c is to: SNF              Anticipated d/c date is: 3 days              Patient currently is not medically stable to d/c.        Code Status:     Code Status Orders  (From admission, onward)         Start     Ordered   10/28/19 1813  Do not attempt resuscitation (DNR)  Continuous       Question Answer Comment  In the event of cardiac or respiratory ARREST Do not call a code blue   In the event of cardiac or respiratory ARREST Do not perform Intubation, CPR, defibrillation or ACLS   In the event of cardiac or respiratory ARREST Use medication by any route, position, wound care, and other measures to relive pain and suffering. May use oxygen, suction and manual treatment of airway obstruction as needed for comfort.      10/28/19 1814        Code Status History    This patient has a current code status but no historical code status.   Advance Care Planning Activity        IV Access:    Peripheral IV   Procedures and diagnostic studies:   No results found.   Medical Consultants:    None.  Anti-Infectives:   Vancomycin and cefepime for 7 days.  Subjective:    KAUAN KLOOSTERMAN relates he significantly better today than yesterday.  Objective:  Vitals:   10/30/19 1643 10/30/19 2042 10/31/19 0020 10/31/19 0452  BP: (!) 117/57 (!) 114/57 127/74 115/68  Pulse: 93 86 86 79  Resp: Temp: 98.7 F (37.1 C) 98.8 F (37.1 C) 98.3 F (36.8 C) 98.4 F (36.9 C)  TempSrc: Oral Oral Oral Oral  SpO2: (!) 86% 92% 96% (!) 89%  Weight:    102.7 kg  Height:       SpO2: (!) 89 % O2 Flow Rate (L/min): 9 L/min FiO2 (%): 50 %   Intake/Output Summary (Last 24 hours) at 10/31/2019 0730 Last data filed at 10/31/2019 0455 Gross per 24 hour  Intake 1456.38 ml  Output 2350 ml  Net -893.62 ml   Filed Weights   10/28/19 1600 10/30/19 0450 10/31/19 0452  Weight: 97.8 kg 100.7 kg 102.7 kg    Exam: General exam: In no  acute distress. Respiratory system: Good air movement and clear to auscultation. Cardiovascular system: S1 & S2 heard, RRR. No JVD. Gastrointestinal system: Abdomen is nondistended, soft and nontender.  Extremities: No pedal edema. Skin: No rashes, lesions or ulcers   Data Reviewed:    Labs: Basic Metabolic Panel: Recent Labs  Lab 10/28/19 1510 10/28/19 1510 10/28/19 1537 10/28/19 1537 10/28/19 2314 10/28/19 2314 10/29/19 0336 10/29/19 0336 10/29/19 0748 10/30/19 0707  NA 135   < > 138   139  --  135  --  137  --  142 144  K 4.2   < > 4.2   4.2   < > 3.7   < > 3.7   < > 3.5 3.5  CL 95*  --  96*  --   --   --  96*  --   --  104  CO2 28  --   --   --   --   --  29  --   --  30  GLUCOSE 315*  --  320*  --   --   --  232*  --   --  163*  BUN 17  --  25*  --   --   --  23  --   --  20  CREATININE 1.34*  --  1.20  --   --   --  1.06  --   --  0.92  CALCIUM 8.3*  --   --   --   --   --  8.2*  --   --  8.3*   < > = values in this interval not displayed.   GFR Estimated Creatinine Clearance: 91.1 mL/min (by C-G formula based on SCr of 0.92 mg/dL). Liver Function Tests: Recent Labs  Lab 10/28/19 1510 10/30/19 0707  AST 28 11*  ALT 16 12  ALKPHOS 103 94  BILITOT 1.3* 1.5*  PROT 8.0 7.8  ALBUMIN 3.1* 2.7*   No results for input(s): LIPASE, AMYLASE in the last 168 hours. No results for input(s): AMMONIA in the last 168 hours. Coagulation profile Recent Labs  Lab 10/28/19 1530  INR 1.3*   COVID-19 Labs  Recent Labs    10/28/19 1530  DDIMER 0.57*    Lab Results  Component Value Date   SARSCOV2NAA NEGATIVE 10/28/2019    CBC: Recent Labs  Lab 10/28/19 1510 10/28/19 1510 10/28/19 1537 10/28/19 2314 10/29/19 0336 10/29/19 0748 10/30/19 0707  WBC 14.6*  --   --   --  13.7*  --  8.9  NEUTROABS 12.5*  --   --   --   --   --   --  HGB 17.6*   < > 20.4*   20.4* 19.0* 17.5* 19.4* 16.4  HCT 58.7*   < > 60.0*   60.0* 56.0* 57.2* 57.0* 55.6*  MCV 93.5  --   --    --  95.0  --  94.9  PLT 187  --   --   --  194  --  168   < > = values in this interval not displayed.   Cardiac Enzymes: No results for input(s): CKTOTAL, CKMB, CKMBINDEX, TROPONINI in the last 168 hours. BNP (last 3 results) No results for input(s): PROBNP in the last 8760 hours. CBG: Recent Labs  Lab 10/30/19 0419 10/30/19 0839 10/30/19 1155 10/30/19 1642 10/30/19 2041  GLUCAP 148* 172* 158* 162* 197*   D-Dimer: Recent Labs    10/28/19 1530  DDIMER 0.57*   Hgb A1c: Recent Labs    10/28/19 1917  HGBA1C 8.9*   Lipid Profile: No results for input(s): CHOL, HDL, LDLCALC, TRIG, CHOLHDL, LDLDIRECT in the last 72 hours. Thyroid function studies: No results for input(s): TSH, T4TOTAL, T3FREE, THYROIDAB in the last 72 hours.  Invalid input(s): FREET3 Anemia work up: No results for input(s): VITAMINB12, FOLATE, FERRITIN, TIBC, IRON, RETICCTPCT in the last 72 hours. Sepsis Labs: Recent Labs  Lab 10/28/19 1510 10/28/19 1710 10/28/19 1917 10/29/19 0336 10/30/19 0707  PROCALCITON  --   --  2.18  --   --   WBC 14.6*  --   --  13.7* 8.9  LATICACIDVEN 2.1* 1.4  --   --   --    Microbiology Recent Results (from the past 240 hour(s))  Blood culture (routine single)     Status: None (Preliminary result)   Collection Time: 10/28/19  3:10 PM   Specimen: BLOOD RIGHT FOREARM  Result Value Ref Range Status   Specimen Description BLOOD RIGHT FOREARM  Final   Special Requests   Final    BOTTLES DRAWN AEROBIC AND ANAEROBIC Blood Culture adequate volume   Culture   Final    NO GROWTH < 24 HOURS Performed at [facility], [address], [place], [place] [address]    Report Status PENDING  Incomplete  SARS Coronavirus 2 by RT PCR (hospital order, performed in [facility] hospital lab) Nasopharyngeal Nasopharyngeal Swab     Status: None   Collection Time: 10/28/19  3:28 PM   Specimen: Nasopharyngeal Swab  Result Value Ref Range Status   SARS Coronavirus 2 NEGATIVE  NEGATIVE Final    Comment: (NOTE) SARS-CoV-2 target nucleic acids are NOT DETECTED.  The SARS-CoV-2 RNA is generally detectable in upper and lower respiratory specimens during the acute phase of infection. The lowest concentration of SARS-CoV-2 viral copies this assay can detect is 250 copies / mL. A negative result does not preclude SARS-CoV-2 infection and should not be used as the sole basis for treatment or other patient management decisions.  A negative result may occur with improper specimen collection / handling, submission of specimen other than nasopharyngeal swab, presence of viral mutation(s) within the areas targeted by this assay, and inadequate number of viral copies (<250 copies / mL). A negative result must be combined with clinical observations, patient history, and epidemiological information.  Fact Sheet for Patients:   BoilerBrush.com.cy  Fact Sheet for Healthcare Providers: https://pope.com/  This test is not yet approved or  cleared by the [place] FDA and has been authorized for detection and/or diagnosis of SARS-CoV-2 by FDA under an Emergency Use Authorization (EUA).  This  EUA will remain in effect (meaning this test can be used) for the duration of the COVID-19 declaration under Section 564(b)(1) of the Act, 21 U.S.C. section 360bbb-3(b)(1), unless the authorization is terminated or revoked sooner.  Performed at [facility], [address] [place], [place] [address]   Culture, blood (single)     Status: None (Preliminary result)   Collection Time: 10/28/19  4:46 PM   Specimen: BLOOD LEFT WRIST  Result Value Ref Range Status   Specimen Description BLOOD LEFT WRIST  Final   Special Requests   Final    BOTTLES DRAWN AEROBIC AND ANAEROBIC Blood Culture adequate volume   Culture   Final    NO GROWTH < 24 HOURS Performed at [facility], [address] [place], [place] [address]     Report Status PENDING  Incomplete  Urine culture     Status: Abnormal   Collection Time: 10/29/19  1:40 AM   Specimen: In/Out Cath Urine  Result Value Ref Range Status   Specimen Description IN/OUT CATH URINE  Final   Special Requests   Final    NONE Performed at [facility], [address] [place], [place] [address]    Culture MULTIPLE SPECIES PRESENT, SUGGEST RECOLLECTION (A)  Final   Report Status 10/29/2019 FINAL  Final  Culture, respiratory (non-expectorated)     Status: None (Preliminary result)   Collection Time: 10/30/19 11:19 AM   Specimen: Sputum; Respiratory  Result Value Ref Range Status   Specimen Description SPU  Final   Special Requests NONE  Final   Gram Stain   Final    FEW WBC PRESENT,BOTH PMN AND MONONUCLEAR RARE GRAM POSITIVE COCCI RARE YEAST Performed at [facility], [address] [place], [place] [address]    Culture PENDING  Incomplete   Report Status PENDING  Incomplete  MRSA PCR Screening     Status: Abnormal   Collection Time: 10/30/19 12:01 PM   Specimen: Nasal Mucosa; Nasopharyngeal  Result Value Ref Range Status   MRSA by PCR POSITIVE (A) NEGATIVE Final    Comment:        The GeneXpert MRSA Assay (FDA approved for NASAL specimens only), is one component of a comprehensive MRSA colonization surveillance program. It is not intended to diagnose MRSA infection nor to guide or monitor treatment for MRSA infections. RESULT CALLED TO, READ BACK BY AND VERIFIED WITH: Kim RN 15:00 10/30/19 ([email or web address]) Performed at [facility], [address] [place], [place] [address]      Medications:    acidophilus  2 capsule Oral Daily   amLODipine  5 mg Oral Daily   aspirin  81 mg Oral Daily   baclofen  10 mg Oral TID   Chlorhexidine Gluconate Cloth  6 each Topical Q0600   diazepam  5 mg Oral TID   docusate sodium  100 mg Oral BID   enoxaparin (LOVENOX) injection  40 mg Subcutaneous Q24H   famotidine  20 mg Oral BID    furosemide  40 mg Intravenous Q12H   insulin aspart  0-15 Units Subcutaneous TID WC   insulin aspart  0-5 Units Subcutaneous QHS   mupirocin ointment  1 application Nasal BID   naloxegol oxalate  25 mg Oral Daily   oxyCODONE-acetaminophen  1 tablet Oral Q6H   And   oxyCODONE  5 mg Oral Q6H   pregabalin  150 mg Oral Q12H   sertraline  75 mg Oral Daily  sodium chloride flush  3 mL Intravenous Q12H   tamsulosin  0.8 mg Oral Daily   Continuous Infusions:  levETIRAcetam 500 mg (10/30/19 2113)   piperacillin-tazobactam (ZOSYN)  IV 3.375 g (10/31/19 0604)   vancomycin 1,250 mg (10/30/19 1747)      LOS: 3 days   Marinda Elk  [facility]  10/31/2019, 7:30 AM

## 2019-10-31 NOTE — Progress Notes (Signed)
Bipap is PRN order.  Patient currently on 6L HFNC [place].  No distress at this time.

## 2019-10-31 NOTE — Progress Notes (Signed)
Pharmacy Antibiotic Note  Sergio Murphy is a 64 y.o. male admitted on 10/28/2019 with pneumonia.  Pharmacy has been consulted for Cefepime and Vancomycin dosing.   Height: 5\' 6"  (167.6 cm) Weight: 102.7 kg (226 lb 6.6 oz) IBW/kg (Calculated) : 63.8  Temp (24hrs), Avg:98.4 F (36.9 C), Min:97.7 F (36.5 C), Max:98.8 F (37.1 C)  Recent Labs  Lab 10/28/19 1510 10/28/19 1537 10/28/19 1710 10/29/19 0336 10/30/19 0707 10/31/19 0717  WBC 14.6*  --   --  13.7* 8.9  --   CREATININE 1.34* 1.20  --  1.06 0.92 0.88  LATICACIDVEN 2.1*  --  1.4  --   --   --     Estimated Creatinine Clearance: 95.2 mL/min (by C-G formula based on SCr of 0.88 mg/dL).    Allergies  Allergen Reactions   Latex Swelling   Benazepril Other (See Comments)    AKI and hyperkalemia   Carbamazepine Other (See Comments)    Reaction not listed on MAR    Antimicrobials this admission: 9/22 Cefepime >>  9/22 Vancomycin >>   Dose adjustments this admission: N/A  Microbiology results: 9/25 Resp Cx: rare gram pos coci, rare yeast (likely colonization) 9/24 MRSA PCR pos 9/22 BCx: NGTD 9/22 UCx: multiple species  Plan:  - Cefepime 2 g IV q8h - Vancomycin 1250 mg IV q24h - Contact provider to suggest 5 day length of therapy - Contact provider to reccommed cefepime deescalation to ceftriaxone - Monitor renal function, clinical status and length of therapy - Follow up recommendations to provider  Thank you for allowing pharmacy to be a part of this patients care.  10/22 PharmD. BCPS 10/31/2019 11:54 AM

## 2019-10-31 NOTE — Evaluation (Signed)
Physical Therapy Evaluation Patient Details Name: Sergio Murphy MRN: [id number] DOB: 07-Jan-1956 Today's Date: 10/31/2019   History of Present Illness  Patient presents to the hospital with PNA. He has C/P and lives in an assisted living. PMH: seizure disorder, depression, anxiety, obesity, chronic pain   Clinical Impression  Patient required significant assist for mobility today. He reports at baseline he was able to transfer to his motorized wheelchair but this is questionable given the amount of assist he required for bed mobility. Therapy was unable to help him maintain a sitting position without total assist. Therapy cued him to use his arms and his core to sit but he consistently fell to the left. He would benefit from rehab at a SNF at this time. Therapy will continue to work with him to get him out of bed and improve his ability to transfer.     Follow Up Recommendations SNF    Equipment Recommendations  None recommended by PT    Recommendations for Other Services Rehab consult     Precautions / Restrictions Precautions Precautions: None Restrictions Weight Bearing Restrictions: No      Mobility  Bed Mobility Overal bed mobility: Needs Assistance Bed Mobility: Sit to Supine;Supine to Sit;Rolling Rolling: Max assist;+2 for physical assistance;+2 for safety/equipment   Supine to sit: Total assist Sit to supine: Total assist   General bed mobility comments: Therapy rolled patient to be cleaned with CNA. He required max a to maintain a rolled poistion. therapy then attmepted to sit patient up at the edge of the bed. Despite MAx a therapy was unable to help patient maintain a sitting position. Therapy gave total assist to keep him from falling to the left.   Transfers                 General transfer comment: unable to get him sitting up enough for a transfer   Ambulation/Gait                Stairs            Wheelchair Mobility    Modified Rankin  (Stroke Patients Only)       Balance Overall balance assessment: Needs assistance Sitting-balance support: Bilateral upper extremity supported;Feet unsupported Sitting balance-Leahy Scale: Zero Sitting balance - Comments: total assist to remain sitting                                      Pertinent Vitals/Pain Pain Assessment: Faces Faces Pain Scale: Hurts even more Pain Location: cervical spine  Pain Descriptors / Indicators: Aching    Home Living Family/patient expects to be discharged to:: Assisted living               Home Equipment: Walker - 2 wheels;Wheelchair - power Additional Comments: assisted libving helps him to a wheelchaitr     Prior Function Level of Independence: Needs assistance   Gait / Transfers Assistance Needed: used a walker to get to his wheelchair with assist   ADL's / Homemaking Assistance Needed: lately he has been getting help         Hand Dominance   Dominant Hand: Right    Extremity/Trunk Assessment   Upper Extremity Assessment Upper Extremity Assessment: Defer to OT evaluation    Lower Extremity Assessment Lower Extremity Assessment: Generalized weakness    Cervical / Trunk Assessment Cervical / Trunk Assessment: Kyphotic;Other exceptions Cervical / Trunk Exceptions:  head sidebent and rotated to thweleft   Communication   Communication: No difficulties  Cognition Arousal/Alertness: Awake/alert Behavior During Therapy: WFL for tasks assessed/performed Overall Cognitive Status: Within Functional Limits for tasks assessed                                 General Comments: very pleasent       General Comments      Exercises     Assessment/Plan    PT Assessment Patient needs continued PT services  PT Problem List Decreased strength;Decreased activity tolerance;Decreased range of motion;Decreased balance;Decreased knowledge of use of DME;Decreased safety awareness       PT Treatment  Interventions Gait training;DME instruction;Stair training;Functional mobility training;Therapeutic activities;Therapeutic exercise;Balance training;Patient/family education    PT Goals (Current goals can be found in the Care Plan section)  Acute Rehab PT Goals Patient Stated Goal: to get moving  PT Goal Formulation: With patient Time For Goal Achievement: 11/07/19 Potential to Achieve Goals: Good    Frequency Min 2X/week   Barriers to discharge        Co-evaluation               AM-PAC PT "6 Clicks" Mobility  Outcome Measure Help needed turning from your back to your side while in a flat bed without using bedrails?: Total Help needed moving from lying on your back to sitting on the side of a flat bed without using bedrails?: Total Help needed moving to and from a bed to a chair (including a wheelchair)?: Total Help needed standing up from a chair using your arms (e.g., wheelchair or bedside chair)?: Total Help needed to walk in hospital room?: Total Help needed climbing 3-5 steps with a railing? : Total 6 Click Score: 6    End of Session   Activity Tolerance: Patient limited by fatigue Patient left: in bed;with call bell/phone within reach Nurse Communication: Mobility status PT Visit Diagnosis: Unsteadiness on feet (R26.81);Other abnormalities of gait and mobility (R26.89);Muscle weakness (generalized) (M62.81);Pain Pain - part of body:  (neck )    Time: 1500-1540 PT Time Calculation (min) (ACUTE ONLY): 40 min   Charges:   PT Evaluation $PT Eval High Complexity: 1 High PT Treatments $Therapeutic Activity: 8-22 mins        Dessie Coma PT DPT  10/31/2019, 4:07 PM

## 2019-11-01 LAB — FOLATE: Folate: 9.3 ng/mL (ref >5.9)

## 2019-11-01 LAB — BLOOD GAS, ARTERIAL
Acid-Base Excess: 10.5 mmol/L — ABNORMAL HIGH (ref 0.0–2.0)
Acid-Base Excess: 11 mmol/L — ABNORMAL HIGH (ref 0.0–2.0)
Bicarbonate: 36.1 mmol/L — ABNORMAL HIGH (ref 20.0–28.0)
Bicarbonate: 36.8 mmol/L — ABNORMAL HIGH (ref 20.0–28.0)
Drawn by: 35043
Drawn by: 42783
FIO2: 44
FIO2: 60
O2 Saturation: 94.8 %
O2 Saturation: 97 %
Patient temperature: 36.5
Patient temperature: 37
pCO2 arterial: 64.7 mmHg — ABNORMAL HIGH (ref 32.0–48.0)
pCO2 arterial: 66.4 mmHg (ref 32.0–48.0)
pH, Arterial: 7.36 (ref 7.350–7.450)
pH, Arterial: 7.365 (ref 7.350–7.450)
pO2, Arterial: 72.5 mmHg — ABNORMAL LOW (ref 83.0–108.0)
pO2, Arterial: 90.6 mmHg (ref 83.0–108.0)

## 2019-11-01 LAB — GLUCOSE, CAPILLARY
Glucose-Capillary: 162 mg/dL — ABNORMAL HIGH (ref 70–99)
Glucose-Capillary: 164 mg/dL — ABNORMAL HIGH (ref 70–99)
Glucose-Capillary: 193 mg/dL — ABNORMAL HIGH (ref 70–99)
Glucose-Capillary: 182 mg/dL — ABNORMAL HIGH (ref 70–99)
Glucose-Capillary: 149 mg/dL — ABNORMAL HIGH (ref 70–99)

## 2019-11-01 LAB — COMPREHENSIVE METABOLIC PANEL
ALT: 11 U/L (ref 0–44)
AST: 8 U/L — ABNORMAL LOW (ref 15–41)
Albumin: 2.9 g/dL — ABNORMAL LOW (ref 3.5–5.0)
Alkaline Phosphatase: 88 U/L (ref 38–126)
Anion gap: 12 (ref 5–15)
BUN: 16 mg/dL (ref 8–23)
CO2: 32 mmol/L (ref 22–32)
Calcium: 8.5 mg/dL — ABNORMAL LOW (ref 8.9–10.3)
Chloride: 94 mmol/L — ABNORMAL LOW (ref 98–111)
Creatinine, Ser: 0.71 mg/dL (ref 0.61–1.24)
GFR calc Af Amer: >60 mL/min (ref >60)
GFR calc non Af Amer: >60 mL/min (ref >60)
Glucose, Bld: 208 mg/dL — ABNORMAL HIGH (ref 70–99)
Potassium: 2.7 mmol/L — CL (ref 3.5–5.1)
Sodium: 138 mmol/L (ref 135–145)
Total Bilirubin: 1.4 mg/dL — ABNORMAL HIGH (ref 0.3–1.2)
Total Protein: 8.3 g/dL — ABNORMAL HIGH (ref 6.5–8.1)

## 2019-11-01 LAB — BASIC METABOLIC PANEL
Anion gap: 11 (ref 5–15)
BUN: 14 mg/dL (ref 8–23)
CO2: 32 mmol/L (ref 22–32)
Calcium: 8.3 mg/dL — ABNORMAL LOW (ref 8.9–10.3)
Chloride: 94 mmol/L — ABNORMAL LOW (ref 98–111)
Creatinine, Ser: 0.71 mg/dL (ref 0.61–1.24)
GFR calc Af Amer: >60 mL/min (ref >60)
GFR calc non Af Amer: >60 mL/min (ref >60)
Glucose, Bld: 173 mg/dL — ABNORMAL HIGH (ref 70–99)
Potassium: 4.9 mmol/L (ref 3.5–5.1)
Sodium: 137 mmol/L (ref 135–145)

## 2019-11-01 LAB — CBC WITH DIFFERENTIAL/PLATELET
Abs Immature Granulocytes: 0.02 10*3/uL (ref 0.00–0.07)
Basophils Absolute: 0.1 10*3/uL (ref 0.0–0.1)
Basophils Relative: 1 %
Eosinophils Absolute: 0.6 10*3/uL — ABNORMAL HIGH (ref 0.0–0.5)
Eosinophils Relative: 7 %
HCT: 53.9 % — ABNORMAL HIGH (ref 39.0–52.0)
Hemoglobin: 16.2 g/dL (ref 13.0–17.0)
Immature Granulocytes: 0 %
Lymphocytes Relative: 27 %
Lymphs Abs: 2.3 10*3/uL (ref 0.7–4.0)
MCH: 28.3 pg (ref 26.0–34.0)
MCHC: 30.1 g/dL (ref 30.0–36.0)
MCV: 94.1 fL (ref 80.0–100.0)
Monocytes Absolute: 0.6 10*3/uL (ref 0.1–1.0)
Monocytes Relative: 7 %
Neutro Abs: 4.8 10*3/uL (ref 1.7–7.7)
Neutrophils Relative %: 58 %
Platelets: 197 10*3/uL (ref 150–400)
RBC: 5.73 MIL/uL (ref 4.22–5.81)
RDW: 13.3 % (ref 11.5–15.5)
WBC: 8.3 10*3/uL (ref 4.0–10.5)
nRBC: 0 % (ref 0.0–0.2)

## 2019-11-01 LAB — CULTURE, RESPIRATORY W GRAM STAIN: Culture: NORMAL

## 2019-11-01 LAB — RPR: RPR Ser Ql: NONREACTIVE

## 2019-11-01 LAB — TSH: TSH: 1.023 u[IU]/mL (ref 0.350–4.500)

## 2019-11-01 LAB — AMMONIA: Ammonia: 48 umol/L — ABNORMAL HIGH (ref 9–35)

## 2019-11-01 LAB — VITAMIN B12: Vitamin B-12: 760 pg/mL (ref 180–914)

## 2019-11-01 LAB — MAGNESIUM: Magnesium: 1.7 mg/dL (ref 1.7–2.4)

## 2019-11-01 MED ORDER — INFLUENZA VAC SPLIT QUAD 0.5 ML IM SUSY
0.5000 mL | PREFILLED_SYRINGE | INTRAMUSCULAR | Status: DC
  Filled 2019-11-01: qty 0.5

## 2019-11-01 MED ORDER — MAGNESIUM SULFATE IN D5W 1-5 GM/100ML-% IV SOLN
1.0000 g | Freq: Once | INTRAVENOUS | Status: AC
  Administered 2019-11-01: 1 g via INTRAVENOUS
  Filled 2019-11-01: qty 100

## 2019-11-01 MED ORDER — POTASSIUM CHLORIDE 10 MEQ/100ML IV SOLN
10.0000 meq | INTRAVENOUS | Status: AC
  Administered 2019-11-01 (×4): 10 meq via INTRAVENOUS
  Filled 2019-11-01 (×4): qty 100

## 2019-11-01 NOTE — Progress Notes (Signed)
CRITICAL VALUE ALERT  Critical Value:  K+ 2.7  Date & Time Notied:  11/01/19 0032  Provider Notified: Dr. Antionette Char  Orders Received/Actions taken: 4 runs IVPB potassium  Adair Patter, RN

## 2019-11-01 NOTE — Progress Notes (Signed)
Occupational Therapy Evaluation Patient Details Name: Sergio Murphy MRN: [id number] DOB: [birth year] Today's Date: 11/01/2019    History of Present Illness 64 y.o. male presenting with sepsis due to HCAP with acute on chronic respiratory failure. PMHx significant for depression/anxiety, HTN, DM, chronic pain syndrome, prior chronic trach-dependent respiratory failure (discontinued about 2 years ago), CP with seizure disorder and prior PEG placement.   Clinical Impression   PTA patient was living at an ALF and was requiring assist with BADLs and functional transfers at baseline. Patient reports for the last several weeks, he's mostly been lying in bed and has rarely gotten up to his PWC. At baseline patient reports ability to feed himself, bathe UB at bed level, and complete stand-pivot transfers with RW to [facility] (although has been unable to recently). Patient currently presents below baseline level of function requiring Max A +2 to Total A +2 for all bed mobility and supine <> EOB transfers. Evaluation limited to bed level this date 2/2 patient CLOF and need for 2-3 person assist 2/2 generalized deconditioning and decreased static sitting balance. Patient would benefit from continued acute OT services to maximize independence with bedlevel BADLs, bed mobility, and supine to sit transfers. Recommendation for SNF rehab given patients CLOF.     Follow Up Recommendations  SNF    Equipment Recommendations  Other (comment) (Defer to next level of care)    Recommendations for Other Services       Precautions / Restrictions Precautions Precautions: None Restrictions Weight Bearing Restrictions: No      Mobility Bed Mobility Overal bed mobility: Needs Assistance Bed Mobility: Sit to Supine;Supine to Sit;Rolling Rolling: Max assist;+2 for physical assistance;+2 for safety/equipment   Supine to sit: Total assist Sit to supine: Total assist   General bed mobility comments: Therapy rolled  patient to be cleaned with CNA. He required max a to maintain a rolled poistion. therapy then attmepted to sit patient up at the edge of the bed. Despite MAx a therapy was unable to help patient maintain a sitting position. Therapy gave total assist to keep him from falling to the left.   Transfers Overall transfer level: Needs assistance               General transfer comment: Limited eval to bed level without +2 assist.     Balance Overall balance assessment: Needs assistance Sitting-balance support: Bilateral upper extremity supported;Feet unsupported Sitting balance-Leahy Scale: Zero                                     ADL either performed or assessed with clinical judgement   ADL Overall ADL's : Needs assistance/impaired Eating/Feeding: Set up Eating/Feeding Details (indicate cue type and reason): Patient able to consume breakfast meal with set-up assist.      Upper Body Bathing: Maximal assistance (+2 assist)   Lower Body Bathing: Total assistance;+2 for physical assistance   Upper Body Dressing : Maximal assistance (+2)   Lower Body Dressing: Total assistance;+2 for physical assistance;+2 for safety/equipment   Toilet Transfer: Total assistance;+2 for physical assistance;+2 for safety/equipment;Requires wide/bariatric                   Vision         Perception Perception Perception Tested?: No   Praxis Praxis Praxis tested?: Not tested    Pertinent Vitals/Pain Pain Assessment: 0-10 Pain Score: 3  Pain Location: Cervical neck and  low back Pain Descriptors / Indicators: Aching     Hand Dominance Right   Extremity/Trunk Assessment Upper Extremity Assessment Upper Extremity Assessment: Generalized weakness   Lower Extremity Assessment Lower Extremity Assessment: Generalized weakness   Cervical / Trunk Assessment Cervical / Trunk Assessment: Kyphotic;Other exceptions Cervical / Trunk Exceptions: head sidebent and rotated to  L   Communication Communication Communication: No difficulties   Cognition Arousal/Alertness: Awake/alert Behavior During Therapy: WFL for tasks assessed/performed Overall Cognitive Status: Within Functional Limits for tasks assessed                                     General Comments       Exercises     Shoulder Instructions      Home Living Family/patient expects to be discharged to:: Assisted living                             Home Equipment: Walker - 2 wheels;Wheelchair - power   Additional Comments: Assist for wc transfers      Prior Functioning/Environment Level of Independence: Needs assistance  Gait / Transfers Assistance Needed: Use of RW for stand-pivot transfers to [facility] ADL's / Homemaking Assistance Needed: Recent assist with ADLs including bathing/dressing            OT Problem List: Decreased strength;Decreased range of motion;Decreased activity tolerance;Impaired balance (sitting and/or standing);Decreased knowledge of use of DME or AE;Cardiopulmonary status limiting activity;Pain;Impaired UE functional use      OT Treatment/Interventions: Self-care/ADL training;Energy conservation;Therapeutic exercise;DME and/or AE instruction;Therapeutic activities;Patient/family education;Balance training    OT Goals(Current goals can be found in the care plan section) Acute Rehab OT Goals Patient Stated Goal: To get better OT Goal Formulation: With patient Time For Goal Achievement: 11/15/19 Potential to Achieve Goals: Fair  OT Frequency: Min 2X/week   Barriers to D/C:            Co-evaluation              AM-PAC OT "6 Clicks" Daily Activity     Outcome Measure Help from another person eating meals?: A Little Help from another person taking care of personal grooming?: A Little Help from another person toileting, which includes using toliet, bedpan, or urinal?: Total Help from another person bathing (including washing,  rinsing, drying)?: Total Help from another person to put on and taking off regular upper body clothing?: Total Help from another person to put on and taking off regular lower body clothing?: Total 6 Click Score: 10   End of Session    Activity Tolerance: Patient tolerated treatment well Patient left: in bed;with call bell/phone within reach;with bed alarm set  OT Visit Diagnosis: Muscle weakness (generalized) (M62.81);Pain                Time: 1100-1118 OT Time Calculation (min): 18 min Charges:  OT General Charges $OT Visit: 1 Visit OT Evaluation $OT Eval Moderate Complexity: 1 Mod  Ainslee Sou H. OTR/L Supplemental OT, Department of rehab services [phone]  Kenly Henckel R H. 11/01/2019, 11:33 AM

## 2019-11-01 NOTE — Progress Notes (Signed)
During night time med pass, patient noted to have some confusion. Patient was answering every question that was asked by stating his birthday. No other neurological deficits were noted upon assessment. Patient was able to identify objects but began to repeat other phrases in place of his birthday when asked questions. Dr. Antionette Char was notified and orders were placed for labs and an ABG. PCO2 resulted at 66.4 and patient was placed back on bipap by respiratory therapist.   Adair Patter, RN

## 2019-11-01 NOTE — Progress Notes (Signed)
Did ABG on patient.  CO2 was elevated in 60s so placed patient back on Bipap (v60).

## 2019-11-01 NOTE — Progress Notes (Signed)
Patient refusing bipap at this time. Placed back on HFNC.  Adair Patter, RN

## 2019-11-01 NOTE — Social Work (Signed)
°  CSW attempted to reach pt by phone and his niece Morrie Sheldon answered the phone stating that pt was sleep and had not sleep well last night. Morrie Sheldon confirmed that she thought the plan was for him to return. Morrie Sheldon stated that we could follow up with patient's sister [phone].

## 2019-11-01 NOTE — Progress Notes (Signed)
Pt is A/O x2 to self and place.  After answering his birthday, he repeated his birthday to all other questions.  Placed Bipap back on and MD made aware.   Hinton Dyer, RN

## 2019-11-02 LAB — CULTURE, BLOOD (SINGLE)
Culture: NO GROWTH
Culture: NO GROWTH
Special Requests: ADEQUATE
Special Requests: ADEQUATE

## 2019-11-02 LAB — GLUCOSE, CAPILLARY
Glucose-Capillary: 181 mg/dL — ABNORMAL HIGH (ref 70–99)
Glucose-Capillary: 279 mg/dL — ABNORMAL HIGH (ref 70–99)
Glucose-Capillary: 102 mg/dL — ABNORMAL HIGH (ref 70–99)
Glucose-Capillary: 127 mg/dL — ABNORMAL HIGH (ref 70–99)
Glucose-Capillary: 122 mg/dL — ABNORMAL HIGH (ref 70–99)

## 2019-11-02 MED ORDER — LEVETIRACETAM 500 MG PO TABS
500.0000 mg | ORAL_TABLET | Freq: Two times a day (BID) | ORAL | Status: DC
  Administered 2019-11-02 – 2019-11-05 (×6): 500 mg via ORAL
  Filled 2019-11-02 (×6): qty 1

## 2019-11-02 MED ORDER — AMOXICILLIN-POT CLAVULANATE 875-125 MG PO TABS
1.0000 | ORAL_TABLET | Freq: Two times a day (BID) | ORAL | Status: AC
  Administered 2019-11-02 – 2019-11-03 (×3): 1 via ORAL
  Filled 2019-11-02 (×3): qty 1

## 2019-11-02 MED ORDER — DIAZEPAM 2 MG PO TABS: 2.0000 mg | ORAL_TABLET | Freq: Three times a day (TID) | ORAL | Status: DC | PRN

## 2019-11-02 NOTE — Progress Notes (Signed)
Visit made to patients room pt is on nasal cannula high flow SP02 94%.  Patient states he don't want to wear BIPAP.  Spoke with RN about this issue she agreed has some trouble getting patient to keep BIPAP on last night.

## 2019-11-02 NOTE — TOC Initial Note (Signed)
Transition of Care [facility]) - Initial/Assessment Note    Patient Details  Name: Sergio Murphy MRN: [id number] Date of Birth: 03-13-1955  [facility] CM/SW Contact:    Terrial Rhodes, LCSWA Phone Number: 11/02/2019, 4:06 PM  Clinical Narrative:                  CSW spoke with patient at bedside. Patient is from [facility] long term care. Patient says plan is to return there. Patient is agreeable to PT at [facility].   CSW will continue to follow.   Expected Discharge Plan: Skilled Nursing Facility Barriers to Discharge: Continued Medical Work up   Patient Goals and CMS Choice Patient states their goals for this hospitalization and ongoing recovery are:: to go to SNF CMS Medicare.gov Compare Post Acute Care list provided to:: Patient Choice offered to / list presented to : Patient  Expected Discharge Plan and Services Expected Discharge Plan: Skilled Nursing Facility       Living arrangements for the past 2 months: Skilled Nursing Facility                                      Prior Living Arrangements/Services Living arrangements for the past 2 months: Skilled Nursing Facility Lives with:: Self, Facility Resident Patient language and need for interpreter reviewed:: Yes Do you feel safe going back to the place where you live?: Yes      Need for Family Participation in Patient Care: Yes (Comment) Care giver support system in place?: Yes (comment)   Criminal Activity/Legal Involvement Pertinent to Current Situation/Hospitalization: No - Comment as needed  Activities of Daily Living      Permission Sought/Granted Permission sought to share information with : Case Manager, Family Supports, Oceanographer granted to share information with : Yes, Verbal Permission Granted  Share Information with NAME: Terri  Permission granted to share info w AGENCY: SNF  Permission granted to share info w Relationship: other  Permission granted to  share info w Contact Information: Camelia Eng [phone]  Emotional Assessment Appearance:: Appears stated age Attitude/Demeanor/Rapport: Gracious Affect (typically observed): Calm Orientation: : Oriented to Self, Oriented to Place, Oriented to  Time, Oriented to Situation Alcohol / Substance Use: Not Applicable Psych Involvement: No (comment)  Admission diagnosis:  Hypercapnia [R06.89] Lactic acid acidosis [E87.2] Hypoxia [R09.02] Healthcare-associated pneumonia [J18.9] AKI (acute kidney injury) (HCC) [N17.9] HCAP (healthcare-associated pneumonia) [J18.9] Patient Active Problem List   Diagnosis Date Noted  . AKI (acute kidney injury) (HCC)   . Lactic acid acidosis   . Healthcare-associated pneumonia 10/28/2019  . Sepsis due to pneumonia (HCC) 10/28/2019  . DNR (do not resuscitate) 10/28/2019  . Cerebral palsy, quadriplegic (HCC)   . Depression with anxiety   . Essential hypertension   . Diabetes mellitus type 2 in obese (HCC)   . Class 1 obesity with body mass index (BMI) of 34.0 to 34.9 in adult   . Chronic pain disorder   . Acute on chronic respiratory failure with hypoxia (HCC) 11/17/2017  . Dysphagia 11/17/2017  . Severe sepsis with septic shock (HCC) 11/17/2017  . Pneumonia due to Pseudomonas (HCC) 11/17/2017  . Cerebral palsy (HCC) 11/17/2017  . Seizure disorder (HCC) 11/17/2017   PCP:  Shayne Alken, MD Pharmacy:  No Pharmacies Listed    Social Determinants of Health (SDOH) Interventions    Readmission Risk Interventions No flowsheet data found.

## 2019-11-02 NOTE — Progress Notes (Signed)
[facility] PROGRESS NOTE    Progress Note  AIDAN MOTEN  KYH:[id number] DOB: 1955/09/14 DOA: 10/28/2019 PCP: Shayne Alken, MD     Brief Narrative:   REVANTH NEIDIG is an 64 y.o. male medical history of cerebral palsy seizure disorder chronic trach dependent respiratory failure discontinued 2 years ago and prior PEG tube, essential hypertension diabetes mellitus type 2 comes into the hospital for shortness of breath over the last several days found to have healthcare associated pneumonia, in the ED ED he required a BiPAP and PCCM was consulted.  Assessment/Plan:   Sepsis due to pneumonia (HCC)/acute respiratory failure with hypoxia and hypercarbia: Sepsis has been ruled in. Overnight as he was sedated he had to be transitioned on BiPAP, will try to wean to room air. We will de-escalate treatment of IV Vanco and cefepime to oral Augmentin. Continue IV Lasix, allow a diet.  Cerebral palsy: Continue current home meds previously had a trach and PEG was DC'd about 3 years ago.  Chronic pain disorder: Continue current regimen.  Essential hypertension: Blood pressure holding steady continue Lasix.  History of seizure disorder: None in house continue Keppra.  Diabetes mellitus type 2: His last A1c 9, he will need to be started on a oral hypoglycemic agent as an outpatient. We will start him on low-dose long-acting insulin.  Depression with anxiety: Continue Zoloft and Valium.  Obesity: Counseling BMI of 35  DNR (do not resuscitate) Admitting MD discussed CODE STATUS with patient and sister they would not like a repeated tracheostomy and would not like to be resuscitated.   DVT prophylaxis: lovenxo Family Communication:Sister Status is: Inpatient  Remains inpatient appropriate because:Hemodynamically unstable   Dispo: The patient is from: Home              Anticipated d/c is to: SNF              Anticipated d/c date is: 3 days              Patient  currently is not medically stable to d/c.        Code Status:     Code Status Orders  (From admission, onward)         Start     Ordered   10/28/19 1813  Do not attempt resuscitation (DNR)  Continuous       Question Answer Comment  In the event of cardiac or respiratory ARREST Do not call a code blue   In the event of cardiac or respiratory ARREST Do not perform Intubation, CPR, defibrillation or ACLS   In the event of cardiac or respiratory ARREST Use medication by any route, position, wound care, and other measures to relive pain and suffering. May use oxygen, suction and manual treatment of airway obstruction as needed for comfort.      10/28/19 1814        Code Status History    This patient has a current code status but no historical code status.   Advance Care Planning Activity        IV Access:    Peripheral IV   Procedures and diagnostic studies:   No results found.   Medical Consultants:    None.  Anti-Infectives:  Augmentin for 1 additional day  Subjective:    AMEDEE CERRONE relates his breathing is improved.  Objective:    Vitals:   11/01/19 2001 11/02/19 0004 11/02/19 0217 11/02/19 0456  BP: (!) 103/51 (!) 112/58  (!) 141/88  Pulse: 82 74 87 73  Resp:  18 (!) 23 (!) 21  Temp: 98.1 F (36.7 C) 97.6 F (36.4 C)  98.3 F (36.8 C)  TempSrc: Oral Oral  Axillary  SpO2: 93% 92% 100% 100%  Weight:      Height:       SpO2: 100 % O2 Flow Rate (L/min): 6 L/min FiO2 (%): 60 %   Intake/Output Summary (Last 24 hours) at 11/02/2019 0737 Last data filed at 11/01/2019 1700 Gross per 24 hour  Intake 363 ml  Output 375 ml  Net -12 ml   Filed Weights   10/28/19 1600 10/30/19 0450 10/31/19 0452  Weight: 97.8 kg 100.7 kg 102.7 kg    Exam: General exam: In no acute distress. Respiratory system: Good air movement and clear to auscultation. Cardiovascular system: S1 & S2 heard, RRR. No JVD. Gastrointestinal system: Abdomen is  nondistended, soft and nontender.  Extremities: No pedal edema. Skin: No rashes, lesions or ulcers Psychiatry: Judgement and insight appear normal. Mood & affect appropriate.   Data Reviewed:    Labs: Basic Metabolic Panel: Recent Labs  Lab 10/29/19 0336 10/29/19 0336 10/29/19 0748 10/29/19 0748 10/30/19 0707 10/30/19 0707 10/31/19 0717 10/31/19 0717 10/31/19 2336 11/01/19 0457  NA 137   < > 142  --  144  --  142  --  138 137  K 3.7   < > 3.5   < > 3.5   < > 3.6   < > 2.7* 4.9  CL 96*  --   --   --  104  --  97*  --  94* 94*  CO2 29  --   --   --  30  --  32  --  32 32  GLUCOSE 232*  --   --   --  163*  --  220*  --  208* 173*  BUN 23  --   --   --  20  --  19  --  16 14  CREATININE 1.06  --   --   --  0.92  --  0.88  --  0.71 0.71  CALCIUM 8.2*  --   --   --  8.3*  --  8.5*  --  8.5* 8.3*  MG  --   --   --   --   --   --   --   --   --  1.7   < > = values in this interval not displayed.   GFR Estimated Creatinine Clearance: 104.8 mL/min (by C-G formula based on SCr of 0.71 mg/dL). Liver Function Tests: Recent Labs  Lab 10/28/19 1510 10/30/19 0707 10/31/19 2336  AST 28 11* 8*  ALT 16 12 11   ALKPHOS 103 94 88  BILITOT 1.3* 1.5* 1.4*  PROT 8.0 7.8 8.3*  ALBUMIN 3.1* 2.7* 2.9*   No results for input(s): LIPASE, AMYLASE in the last 168 hours. Recent Labs  Lab 10/31/19 2336  AMMONIA 48*   Coagulation profile Recent Labs  Lab 10/28/19 1530  INR 1.3*   COVID-19 Labs  No results for input(s): DDIMER, FERRITIN, LDH, CRP in the last 72 hours.  Lab Results  Component Value Date   SARSCOV2NAA NEGATIVE 10/28/2019    CBC: Recent Labs  Lab 10/28/19 1510 10/28/19 1537 10/28/19 2314 10/29/19 0336 10/29/19 0748 10/30/19 0707 10/31/19 2336  WBC 14.6*  --   --  13.7*  --  8.9 8.3  NEUTROABS 12.5*  --   --   --   --   --  4.8  HGB 17.6*   < > 19.0* 17.5* 19.4* 16.4 16.2  HCT 58.7*   < > 56.0* 57.2* 57.0* 55.6* 53.9*  MCV 93.5  --   --  95.0  --  94.9  94.1  PLT 187  --   --  194  --  168 197   < > = values in this interval not displayed.   Cardiac Enzymes: No results for input(s): CKTOTAL, CKMB, CKMBINDEX, TROPONINI in the last 168 hours. BNP (last 3 results) No results for input(s): PROBNP in the last 8760 hours. CBG: Recent Labs  Lab 11/01/19 0802 11/01/19 1057 11/01/19 1255 11/01/19 1647 11/01/19 2050  GLUCAP 162* 164* 193* 182* 149*   D-Dimer: No results for input(s): DDIMER in the last 72 hours. Hgb A1c: No results for input(s): HGBA1C in the last 72 hours. Lipid Profile: No results for input(s): CHOL, HDL, LDLCALC, TRIG, CHOLHDL, LDLDIRECT in the last 72 hours. Thyroid function studies: Recent Labs    10/31/19 06-Jun-2334  TSH 1.023   Anemia work up: Recent Labs    10/31/19 06-06-34  VITAMINB12 760  FOLATE 9.3   Sepsis Labs: Recent Labs  Lab 10/28/19 1510 10/28/19 1710 10/28/19 1917 10/29/19 0336 10/30/19 0707 10/31/19 06/06/2334  PROCALCITON  --   --  2.18  --   --   --   WBC 14.6*  --   --  13.7* 8.9 8.3  LATICACIDVEN 2.1* 1.4  --   --   --   --    Microbiology Recent Results (from the past 240 hour(s))  Blood culture (routine single)     Status: None   Collection Time: 10/28/19  3:10 PM   Specimen: BLOOD RIGHT FOREARM  Result Value Ref Range Status   Specimen Description BLOOD RIGHT FOREARM  Final   Special Requests   Final    BOTTLES DRAWN AEROBIC AND ANAEROBIC Blood Culture adequate volume   Culture   Final    NO GROWTH 5 DAYS Performed at [facility], [address] [place], [place] [address]    Report Status 11/02/2019 FINAL  Final  SARS Coronavirus 2 by RT PCR (hospital order, performed in [facility] hospital lab) Nasopharyngeal Nasopharyngeal Swab     Status: None   Collection Time: 10/28/19  3:28 PM   Specimen: Nasopharyngeal Swab  Result Value Ref Range Status   SARS Coronavirus 2 NEGATIVE NEGATIVE Final    Comment: (NOTE) SARS-CoV-2 target nucleic acids are NOT DETECTED.  The  SARS-CoV-2 RNA is generally detectable in upper and lower respiratory specimens during the acute phase of infection. The lowest concentration of SARS-CoV-2 viral copies this assay can detect is 250 copies / mL. A negative result does not preclude SARS-CoV-2 infection and should not be used as the sole basis for treatment or other patient management decisions.  A negative result may occur with improper specimen collection / handling, submission of specimen other than nasopharyngeal swab, presence of viral mutation(s) within the areas targeted by this assay, and inadequate number of viral copies (<250 copies / mL). A negative result must be combined with clinical observations, patient history, and epidemiological information.  Fact Sheet for Patients:   BoilerBrush.com.cy  Fact Sheet for Healthcare Providers: https://pope.com/  This test is not yet approved or  cleared by the [place] FDA and has been authorized for detection and/or diagnosis of SARS-CoV-2 by FDA under an Emergency Use Authorization (EUA).  This EUA will remain in effect (meaning this test can be  used) for the duration of the COVID-19 declaration under Section 564(b)(1) of the Act, 21 U.S.C. section 360bbb-3(b)(1), unless the authorization is terminated or revoked sooner.  Performed at [facility] Lab, [address], [place], [place] [address]   Culture, blood (single)     Status: None   Collection Time: 10/28/19  4:46 PM   Specimen: BLOOD LEFT WRIST  Result Value Ref Range Status   Specimen Description BLOOD LEFT WRIST  Final   Special Requests   Final    BOTTLES DRAWN AEROBIC AND ANAEROBIC Blood Culture adequate volume   Culture   Final    NO GROWTH 5 DAYS Performed at [facility], [address] [place], [place] [address]    Report Status 11/02/2019 FINAL  Final  Urine culture     Status: Abnormal   Collection Time: 10/29/19  1:40 AM    Specimen: In/Out Cath Urine  Result Value Ref Range Status   Specimen Description IN/OUT CATH URINE  Final   Special Requests   Final    NONE Performed at [facility], [address], [place], [place] [address]    Culture MULTIPLE SPECIES PRESENT, SUGGEST RECOLLECTION (A)  Final   Report Status 10/29/2019 FINAL  Final  Culture, respiratory (non-expectorated)     Status: None   Collection Time: 10/30/19 11:19 AM   Specimen: Sputum; Respiratory  Result Value Ref Range Status   Specimen Description SPU  Final   Special Requests NONE  Final   Gram Stain   Final    FEW WBC PRESENT,BOTH PMN AND MONONUCLEAR RARE GRAM POSITIVE COCCI RARE YEAST    Culture   Final    MODERATE Normal respiratory flora-no Staph aureus or Pseudomonas seen Performed at [facility], [address], [place], [place] [address]    Report Status 11/01/2019 FINAL  Final  MRSA PCR Screening     Status: Abnormal   Collection Time: 10/30/19 12:01 PM   Specimen: Nasal Mucosa; Nasopharyngeal  Result Value Ref Range Status   MRSA by PCR POSITIVE (A) NEGATIVE Final    Comment:        The GeneXpert MRSA Assay (FDA approved for NASAL specimens only), is one component of a comprehensive MRSA colonization surveillance program. It is not intended to diagnose MRSA infection nor to guide or monitor treatment for MRSA infections. RESULT CALLED TO, READ BACK BY AND VERIFIED WITH: Kim RN 15:00 10/30/19 ([email or web address]) Performed at [facility] Lab, [address] [place], [place] [address]      Medications:    acidophilus  2 capsule Oral Daily   amLODipine  5 mg Oral Daily   aspirin  81 mg Oral Daily   Chlorhexidine Gluconate Cloth  6 each Topical Q0600   docusate sodium  100 mg Oral BID   enoxaparin (LOVENOX) injection  40 mg Subcutaneous Q24H   famotidine  20 mg Oral BID   influenza vac split quadrivalent PF  0.5 mL Intramuscular Tomorrow-1000   insulin aspart  0-15 Units Subcutaneous TID WC    insulin aspart  0-5 Units Subcutaneous QHS   insulin glargine  5 Units Subcutaneous QHS   mupirocin ointment  1 application Nasal BID   naloxegol oxalate  25 mg Oral Daily   oxyCODONE-acetaminophen  1 tablet Oral Q6H   And   oxyCODONE  5 mg Oral Q6H   pregabalin  150 mg Oral Q12H   sertraline  75 mg Oral Daily   sodium  chloride flush  3 mL Intravenous Q12H   tamsulosin  0.8 mg Oral Daily   Continuous Infusions:  ceFEPime (MAXIPIME) IV 2 g (11/02/19 [id number])   levETIRAcetam 500 mg (11/01/19 2145)   vancomycin 1,250 mg (11/01/19 1810)      LOS: 5 days   Marinda ElkAbraham Feliz Ortiz  [facility]  11/02/2019, 7:37 AM

## 2019-11-02 NOTE — Progress Notes (Deleted)
Pt has self administered CPAP.  Advised if he needs anything concerning it to have RN call RT.

## 2019-11-02 NOTE — NC FL2 (Signed)
[place] MEDICAID FL2 LEVEL OF CARE SCREENING TOOL     IDENTIFICATION  Patient Name: Sergio Murphy Birthdate: [birth year] Sex: male Admission Date (Current Location): 10/28/2019  [facility] and [place] Number:  Producer, television/film/video and Address:  [facility] [place], [address], [place], [place] [address]      Provider Number: [id number]  Attending Physician Name and Address:  Marinda Elk, MD  Relative Name and Phone Number:  Camelia Eng [phone]    Current Level of Care: Hospital Recommended Level of Care: Skilled Nursing Facility Prior Approval Number:    Date Approved/Denied:   PASRR Number: [id number]  Discharge Plan: SNF    Current Diagnoses: Patient Active Problem List   Diagnosis Date Noted  . AKI (acute kidney injury) (HCC)   . Lactic acid acidosis   . Healthcare-associated pneumonia 10/28/2019  . Sepsis due to pneumonia (HCC) 10/28/2019  . DNR (do not resuscitate) 10/28/2019  . Cerebral palsy, quadriplegic (HCC)   . Depression with anxiety   . Essential hypertension   . Diabetes mellitus type 2 in obese (HCC)   . Class 1 obesity with body mass index (BMI) of 34.0 to 34.9 in adult   . Chronic pain disorder   . Acute on chronic respiratory failure with hypoxia (HCC) 11/17/2017  . Dysphagia 11/17/2017  . Severe sepsis with septic shock (HCC) 11/17/2017  . Pneumonia due to Pseudomonas (HCC) 11/17/2017  . Cerebral palsy (HCC) 11/17/2017  . Seizure disorder (HCC) 11/17/2017    Orientation RESPIRATION BLADDER Height & Weight     Self, Time, Situation, Place  O2 (Nasal cannula) Incontinent, External catheter (External Urinary Catheter) Weight: 226 lb 6.6 oz (102.7 kg) Height:  5\' 6"  (167.6 cm)  BEHAVIORAL SYMPTOMS/MOOD NEUROLOGICAL BOWEL NUTRITION STATUS      Incontinent Diet (Please see DC Summary)  AMBULATORY STATUS COMMUNICATION OF NEEDS Skin   Total Care Verbally Normal                       Personal Care Assistance  Level of Assistance  Bathing, Feeding, Dressing Bathing Assistance: Maximum assistance Feeding assistance: Limited assistance Dressing Assistance: Maximum assistance     Functional Limitations Info  Sight, Hearing, Speech Sight Info: Adequate Hearing Info: Adequate Speech Info: Adequate    SPECIAL CARE FACTORS FREQUENCY  PT (By licensed PT), OT (By licensed OT)     PT Frequency: 5x/week OT Frequency: 4x/week            Contractures Contractures Info: Not present    Additional Factors Info  Code Status, Allergies, Insulin Sliding Scale, Psychotropic, Isolation Precautions Code Status Info: DNR Allergies Info: Latex, Benazepril, Carbamazepine Psychotropic Info: Zoloft Insulin Sliding Scale Info: See DC Summary Isolation Precautions Info: MRSA     Current Medications (11/02/2019):  This is the current hospital active medication list Current Facility-Administered Medications  Medication Dose Route Frequency Provider Last Rate Last Admin  . acetaminophen (TYLENOL) tablet 650 mg  650 mg Oral Q6H PRN 11/04/2019, MD   650 mg at 10/29/19 0140   Or  . acetaminophen (TYLENOL) suppository 650 mg  650 mg Rectal Q6H PRN 10/31/19, MD      . acidophilus (RISAQUAD) capsule 2 capsule  2 capsule Oral Daily Jonah Blue, MD   2 capsule at 11/02/19 1010  . albuterol (PROVENTIL) (2.5 MG/3ML) 0.083% nebulizer solution 2.5 mg  2.5 mg Inhalation Q2H PRN 11/04/19, MD      . amLODipine (NORVASC)  tablet 5 mg  5 mg Oral Daily Jonah Blue, MD   5 mg at 11/02/19 1010  . amoxicillin-clavulanate (AUGMENTIN) 875-125 MG per tablet 1 tablet  1 tablet Oral Q12H Marinda Elk, MD      . aspirin chewable tablet 81 mg  81 mg Oral Daily Jonah Blue, MD   81 mg at 11/02/19 1009  . Chlorhexidine Gluconate Cloth 2 % PADS 6 each  6 each Topical Q0600 Marinda Elk, MD   6 each at 11/02/19 [phone]  . diazepam (VALIUM) tablet 2 mg  2 mg Oral Q8H PRN Marinda Elk, MD       . docusate sodium (COLACE) capsule 100 mg  100 mg Oral BID Jonah Blue, MD   100 mg at 11/02/19 1012  . enoxaparin (LOVENOX) injection 40 mg  40 mg Subcutaneous Q24H Jonah Blue, MD   40 mg at 11/01/19 1802  . famotidine (PEPCID) tablet 20 mg  20 mg Oral BID Jonah Blue, MD   20 mg at 11/02/19 1011  . guaiFENesin (ROBITUSSIN) 100 MG/5ML solution 100 mg  5 mL Oral Q4H PRN Marinda Elk, MD   100 mg at 11/02/19 0002  . influenza vac split quadrivalent PF (FLUARIX) injection 0.5 mL  0.5 mL Intramuscular Tomorrow-1000 Marinda Elk, MD      . insulin aspart (novoLOG) injection 0-15 Units  0-15 Units Subcutaneous TID WC Jonah Blue, MD   8 Units at 11/02/19 1144  . insulin aspart (novoLOG) injection 0-5 Units  0-5 Units Subcutaneous QHS Jonah Blue, MD   2 Units at 10/31/19 2208  . insulin glargine (LANTUS) injection 5 Units  5 Units Subcutaneous QHS Marinda Elk, MD   5 Units at 11/01/19 2146  . levETIRAcetam (KEPPRA) tablet 500 mg  500 mg Oral BID Marinda Elk, MD      . mupirocin ointment (BACTROBAN) 2 % 1 application  1 application Nasal BID Marinda Elk, MD   1 application at 11/02/19 1018  . naloxegol oxalate (MOVANTIK) tablet 25 mg  25 mg Oral Daily Jonah Blue, MD   25 mg at 11/02/19 1010  . ondansetron (ZOFRAN) tablet 4 mg  4 mg Oral Q6H PRN Jonah Blue, MD       Or  . ondansetron [facility]) injection 4 mg  4 mg Intravenous Q6H PRN Jonah Blue, MD   4 mg at 10/29/19 1223  . oxyCODONE-acetaminophen (PERCOCET/ROXICET) 5-325 MG per tablet 1 tablet  1 tablet Oral Q6H Jonah Blue, MD   1 tablet at 11/01/19 1801   And  . oxyCODONE (Oxy IR/ROXICODONE) immediate release tablet 5 mg  5 mg Oral Q6H Jonah Blue, MD   5 mg at 11/02/19 1011  . oxyCODONE-acetaminophen (PERCOCET/ROXICET) 5-325 MG per tablet 1 tablet  1 tablet Oral Q12H PRN Jonah Blue, MD   1 tablet at 11/02/19 1012  . pregabalin (LYRICA) capsule 150 mg  150 mg  Oral Q12H Jonah Blue, MD   150 mg at 11/02/19 1009  . sertraline (ZOLOFT) tablet 75 mg  75 mg Oral Daily Jonah Blue, MD   75 mg at 11/02/19 1009  . sodium chloride flush (NS) 0.9 % injection 3 mL  3 mL Intravenous Q12H Jonah Blue, MD   3 mL at 11/01/19 0940  . tamsulosin (FLOMAX) capsule 0.8 mg  0.8 mg Oral Daily Jonah Blue, MD   0.8 mg at 11/02/19 1011     Discharge Medications: Please see discharge summary for a list of discharge  medications.  Relevant Imaging Results:  Relevant Lab Results:   Additional Information SSN: [id number] [place], [place]

## 2019-11-02 NOTE — Progress Notes (Signed)
RT checked on patient x 2 to inquire about the BIPAP for the evening. Patient stated that the really did not want to wear the BIPAP for the evening. RT informed patient of the need to wear it for the evening but he still did not want to wear the BIPAP.

## 2019-11-02 NOTE — Progress Notes (Addendum)
Pt's O2 sats sustaining at 87%, increased HFNC to 13 liters. Pt educated on BiPap machine, pt continues to refuse. Opyd, MD made aware. Will continue to monitor.   Bari Edward, RN

## 2019-11-03 LAB — GLUCOSE, CAPILLARY
Glucose-Capillary: 173 mg/dL — ABNORMAL HIGH (ref 70–99)
Glucose-Capillary: 158 mg/dL — ABNORMAL HIGH (ref 70–99)
Glucose-Capillary: 133 mg/dL — ABNORMAL HIGH (ref 70–99)
Glucose-Capillary: 161 mg/dL — ABNORMAL HIGH (ref 70–99)

## 2019-11-03 NOTE — Progress Notes (Signed)
[facility] PROGRESS NOTE    Progress Note  Sergio Murphy  ZOX:[id number] DOB: Feb 17, 1955 DOA: 10/28/2019 PCP: Shayne Alken, MD     Brief Narrative:   Sergio Murphy is an 64 y.o. male medical history of cerebral palsy seizure disorder chronic trach dependent respiratory failure discontinued 2 years ago and prior PEG tube, essential hypertension diabetes mellitus type 2 comes into the hospital for shortness of breath over the last several days found to have healthcare associated pneumonia, in the ED ED he required a BiPAP and PCCM was consulted.  Assessment/Plan:   Sepsis due to pneumonia (HCC)/acute respiratory failure with hypoxia and hypercarbia: Sepsis has been ruled in. Overnight as he was sedated he had to be transitioned on BiPAP, je will need bipap at night. Still requiring 5L of oxygen, with speech he desat quickly to 88%. Try to wena him to room air.  On him any saturation above 88% is good. He probably can d/c in 48hrs. Has completed his course of IV antibiotics.  Cerebral palsy: Continue current home meds previously had a trach and PEG was DC'd about 3 years ago.  Chronic pain disorder: Continue current regimen.  Essential hypertension: Blood pressure holding steady continue Lasix.  History of seizure disorder: None in house continue Keppra.  Diabetes mellitus type 2: His last A1c 9, he will need to be started on a oral hypoglycemic agent as an outpatient. We will start him on low-dose long-acting insulin.  Depression with anxiety: Continue Zoloft and Valium.  Obesity: Counseling BMI of 35  DNR (do not resuscitate) Admitting MD discussed CODE STATUS with patient and sister they would not like a repeated tracheostomy and would not like to be resuscitated.   DVT prophylaxis: lovenxo Family Communication:Sister Status is: Inpatient  Remains inpatient appropriate because:Hemodynamically unstable   Dispo: The patient is from: Home               Anticipated d/c is to: SNF              Anticipated d/c date is: 3 days              Patient currently is not medically stable to d/c.once he is at 4L of oxygen, can be d/c in 24-48 hrs.        Code Status:     Code Status Orders  (From admission, onward)         Start     Ordered   10/28/19 1813  Do not attempt resuscitation (DNR)  Continuous       Question Answer Comment  In the event of cardiac or respiratory ARREST Do not call a "code blue"   In the event of cardiac or respiratory ARREST Do not perform Intubation, CPR, defibrillation or ACLS   In the event of cardiac or respiratory ARREST Use medication by any route, position, wound care, and other measures to relive pain and suffering. May use oxygen, suction and manual treatment of airway obstruction as needed for comfort.      10/28/19 1814        Code Status History    This patient has a current code status but no historical code status.   Advance Care Planning Activity        IV Access:    Peripheral IV   Procedures and diagnostic studies:   No results found.   Medical Consultants:    None.  Anti-Infectives:  Augmentin for 1 additional day  Subjective:  Sergio Murphy no complains  Objective:    Vitals:   11/02/19 1620 11/02/19 1920 11/03/19 0500 11/03/19 0749  BP: 123/75 (!) 148/82 117/63 122/67  Pulse: 84 85 87 85  Resp: 19   18  Temp: 98.6 F (37 C) 98.2 F (36.8 C) 97.6 F (36.4 C) 97.8 F (36.6 C)  TempSrc: Oral Oral Oral Oral  SpO2: (!) 87% 93% 98% 100%  Weight:   101.1 kg   Height:       SpO2: 100 % O2 Flow Rate (L/min): 5 L/min FiO2 (%): (!) 0 %   Intake/Output Summary (Last 24 hours) at 11/03/2019 0959 Last data filed at 11/03/2019 0751 Gross per 24 hour  Intake --  Output 625 ml  Net -625 ml   Filed Weights   10/30/19 0450 10/31/19 0452 11/03/19 0500  Weight: 100.7 kg 102.7 kg 101.1 kg    Exam: General exam: In no acute distress. Respiratory  system: Good air movement and clear to auscultation. Cardiovascular system: S1 & S2 heard, RRR. No JVD. Gastrointestinal system: Abdomen is nondistended, soft and nontender.  Extremities: No pedal edema. Skin: No rashes, lesions or ulcers Psychiatry: Judgement and insight appear normal. Mood & affect appropriate.   Data Reviewed:    Labs: Basic Metabolic Panel: Recent Labs  Lab 10/29/19 0336 10/29/19 0336 10/29/19 0748 10/29/19 0748 10/30/19 0707 10/30/19 0707 10/31/19 0717 10/31/19 0717 10/31/19 2336 11/01/19 0457  NA 137   < > 142  --  144  --  142  --  138 137  K 3.7   < > 3.5   < > 3.5   < > 3.6   < > 2.7* 4.9  CL 96*  --   --   --  104  --  97*  --  94* 94*  CO2 29  --   --   --  30  --  32  --  32 32  GLUCOSE 232*  --   --   --  163*  --  220*  --  208* 173*  BUN 23  --   --   --  20  --  19  --  16 14  CREATININE 1.06  --   --   --  0.92  --  0.88  --  0.71 0.71  CALCIUM 8.2*  --   --   --  8.3*  --  8.5*  --  8.5* 8.3*  MG  --   --   --   --   --   --   --   --   --  1.7   < > = values in this interval not displayed.   GFR Estimated Creatinine Clearance: 103.8 mL/min (by C-G formula based on SCr of 0.71 mg/dL). Liver Function Tests: Recent Labs  Lab 10/28/19 1510 10/30/19 0707 10/31/19 2336  AST 28 11* 8*  ALT ALKPHOS 103 94 88  BILITOT 1.3* 1.5* 1.4*  PROT 8.0 7.8 8.3*  ALBUMIN 3.1* 2.7* 2.9*   No results for input(s): LIPASE, AMYLASE in the last 168 hours. Recent Labs  Lab 10/31/19 2336  AMMONIA 48*   Coagulation profile Recent Labs  Lab 10/28/19 1530  INR 1.3*   COVID-19 Labs  No results for input(s): DDIMER, FERRITIN, LDH, CRP in the last 72 hours.  Lab Results  Component Value Date   SARSCOV2NAA NEGATIVE 10/28/2019    CBC: Recent Labs  Lab 10/28/19 1510 10/28/19 1537 10/28/19  2314 10/29/19 0336 10/29/19 0748 10/30/19 0707 10/31/19 2336  WBC 14.6*  --   --  13.7*  --  8.9 8.3  NEUTROABS 12.5*  --   --   --   --    --  4.8  HGB 17.6*   < > 19.0* 17.5* 19.4* 16.4 16.2  HCT 58.7*   < > 56.0* 57.2* 57.0* 55.6* 53.9*  MCV 93.5  --   --  95.0  --  94.9 94.1  PLT 187  --   --  194  --  168 197   < > = values in this interval not displayed.   Cardiac Enzymes: No results for input(s): CKTOTAL, CKMB, CKMBINDEX, TROPONINI in the last 168 hours. BNP (last 3 results) No results for input(s): PROBNP in the last 8760 hours. CBG: Recent Labs  Lab 11/02/19 1054 11/02/19 1537 11/02/19 1712 11/02/19 Jun 12, 2121 11/03/19 0753  GLUCAP 279* 102* 127* 122* 173*   D-Dimer: No results for input(s): DDIMER in the last 72 hours. Hgb A1c: No results for input(s): HGBA1C in the last 72 hours. Lipid Profile: No results for input(s): CHOL, HDL, LDLCALC, TRIG, CHOLHDL, LDLDIRECT in the last 72 hours. Thyroid function studies: Recent Labs    10/31/19 13-Jun-2334  TSH 1.023   Anemia work up: Recent Labs    10/31/19 Jun 13, 2334  VITAMINB12 760  FOLATE 9.3   Sepsis Labs: Recent Labs  Lab 10/28/19 1510 10/28/19 1710 10/28/19 1917 10/29/19 0336 10/30/19 0707 10/31/19 06-13-34  PROCALCITON  --   --  2.18  --   --   --   WBC 14.6*  --   --  13.7* 8.9 8.3  LATICACIDVEN 2.1* 1.4  --   --   --   --    Microbiology Recent Results (from the past 240 hour(s))  Blood culture (routine single)     Status: None   Collection Time: 10/28/19  3:10 PM   Specimen: BLOOD RIGHT FOREARM  Result Value Ref Range Status   Specimen Description BLOOD RIGHT FOREARM  Final   Special Requests   Final    BOTTLES DRAWN AEROBIC AND ANAEROBIC Blood Culture adequate volume   Culture   Final    NO GROWTH 5 DAYS Performed at [facility], [address], [place], [place] [address]    Report Status 11/02/2019 FINAL  Final  SARS Coronavirus 2 by RT PCR (hospital order, performed in [facility] hospital lab) Nasopharyngeal Nasopharyngeal Swab     Status: None   Collection Time: 10/28/19  3:28 PM   Specimen: Nasopharyngeal Swab  Result Value Ref Range  Status   SARS Coronavirus 2 NEGATIVE NEGATIVE Final    Comment: (NOTE) SARS-CoV-2 target nucleic acids are NOT DETECTED.  The SARS-CoV-2 RNA is generally detectable in upper and lower respiratory specimens during the acute phase of infection. The lowest concentration of SARS-CoV-2 viral copies this assay can detect is 250 copies / mL. A negative result does not preclude SARS-CoV-2 infection and should not be used as the sole basis for treatment or other patient management decisions.  A negative result may occur with improper specimen collection / handling, submission of specimen other than nasopharyngeal swab, presence of viral mutation(s) within the areas targeted by this assay, and inadequate number of viral copies (<250 copies / mL). A negative result must be combined with clinical observations, patient history, and epidemiological information.  Fact Sheet for Patients:   BoilerBrush.com.cy  Fact Sheet for Healthcare Providers: https://pope.com/  This test  is not yet approved or  cleared by the [place] FDA and has been authorized for detection and/or diagnosis of SARS-CoV-2 by FDA under an Emergency Use Authorization (EUA).  This EUA will remain in effect (meaning this test can be used) for the duration of the COVID-19 declaration under Section 564(b)(1) of the Act, 21 U.S.C. section 360bbb-3(b)(1), unless the authorization is terminated or revoked sooner.  Performed at [facility], [address] [place], [place] [address]   Culture, blood (single)     Status: None   Collection Time: 10/28/19  4:46 PM   Specimen: BLOOD LEFT WRIST  Result Value Ref Range Status   Specimen Description BLOOD LEFT WRIST  Final   Special Requests   Final    BOTTLES DRAWN AEROBIC AND ANAEROBIC Blood Culture adequate volume   Culture   Final    NO GROWTH 5 DAYS Performed at [facility], [address] [place], [place]  [address]    Report Status 11/02/2019 FINAL  Final  Urine culture     Status: Abnormal   Collection Time: 10/29/19  1:40 AM   Specimen: In/Out Cath Urine  Result Value Ref Range Status   Specimen Description IN/OUT CATH URINE  Final   Special Requests   Final    NONE Performed at [facility], [address] [place], [place] [address]    Culture MULTIPLE SPECIES PRESENT, SUGGEST RECOLLECTION (A)  Final   Report Status 10/29/2019 FINAL  Final  Culture, respiratory (non-expectorated)     Status: None   Collection Time: 10/30/19 11:19 AM   Specimen: Sputum; Respiratory  Result Value Ref Range Status   Specimen Description SPU  Final   Special Requests NONE  Final   Gram Stain   Final    FEW WBC PRESENT,BOTH PMN AND MONONUCLEAR RARE GRAM POSITIVE COCCI RARE YEAST    Culture   Final    MODERATE Normal respiratory flora-no Staph aureus or Pseudomonas seen Performed at [facility], [address] [place], [place] [address]    Report Status 11/01/2019 FINAL  Final  MRSA PCR Screening     Status: Abnormal   Collection Time: 10/30/19 12:01 PM   Specimen: Nasal Mucosa; Nasopharyngeal  Result Value Ref Range Status   MRSA by PCR POSITIVE (A) NEGATIVE Final    Comment:        The GeneXpert MRSA Assay (FDA approved for NASAL specimens only), is one component of a comprehensive MRSA colonization surveillance program. It is not intended to diagnose MRSA infection nor to guide or monitor treatment for MRSA infections. RESULT CALLED TO, READ BACK BY AND VERIFIED WITH: Kim RN 15:00 10/30/19 ([email or web address]) Performed at [facility], [address] [place], [place] [address]      Medications:   . acidophilus  2 capsule Oral Daily  . amLODipine  5 mg Oral Daily  . amoxicillin-clavulanate  1 tablet Oral Q12H  . aspirin  81 mg Oral Daily  . Chlorhexidine Gluconate Cloth  6 each Topical Q0600  . docusate sodium  100 mg Oral BID  . enoxaparin (LOVENOX) injection  40 mg  Subcutaneous Q24H  . famotidine  20 mg Oral BID  . influenza vac split quadrivalent PF  0.5 mL Intramuscular Tomorrow-1000  . insulin aspart  0-15 Units Subcutaneous TID WC  . insulin aspart  0-5 Units Subcutaneous QHS  . insulin glargine  5 Units Subcutaneous QHS  . levETIRAcetam  500 mg  Oral BID  . mupirocin ointment  1 application Nasal BID  . naloxegol oxalate  25 mg Oral Daily  . oxyCODONE-acetaminophen  1 tablet Oral Q6H   And  . oxyCODONE  5 mg Oral Q6H  . pregabalin  150 mg Oral Q12H  . sertraline  75 mg Oral Daily  . sodium chloride flush  3 mL Intravenous Q12H  . tamsulosin  0.8 mg Oral Daily   Continuous Infusions:     LOS: 6 days   Marinda Elk  [facility]  11/03/2019, 9:59 AM

## 2019-11-03 NOTE — Progress Notes (Signed)
Physical Therapy Treatment Patient Details Name: Sergio Murphy MRN: [id number] DOB: 1955/12/23 Today's Date: 11/03/2019    History of Present Illness 64 y.o. male presenting with sepsis due to HCAP with acute on chronic respiratory failure. PMHx significant for depression/anxiety, HTN, DM, chronic pain syndrome, prior chronic trach-dependent respiratory failure (discontinued about 2 years ago), CP with seizure disorder and prior PEG placement.    PT Comments    Pt reports he knows he should get up out of bed, but is not excited about it. Given pt's pain, and weakness, utilized Maximove to move to chair. Pt is max Ax2 for rolling side to side for lift pad placement. Pt with increased neck and back pain with movement. D/c plans remain appropriate at this time. PT will continue to follow acutely.   Follow Up Recommendations  SNF     Equipment Recommendations  None recommended by PT    Recommendations for Other Services Rehab consult     Precautions / Restrictions Precautions Precautions: None Restrictions Weight Bearing Restrictions: No    Mobility  Bed Mobility Overal bed mobility: Needs Assistance Bed Mobility: Sit to Supine;Supine to Sit;Rolling Rolling: Max assist;+2 for physical assistance            Transfers Overall transfer level: Needs assistance               General transfer comment: transferred to recliner with instruction to get back to bed after about an hour, Geomat ordered for future transfers to chair     Balance Overall balance assessment: Needs assistance Sitting-balance support: Bilateral upper extremity supported;Feet unsupported Sitting balance-Leahy Scale: Zero Sitting balance - Comments: requires support from recliner to maintain sitting                                     Cognition Arousal/Alertness: Awake/alert Behavior During Therapy: WFL for tasks assessed/performed Overall Cognitive Status: Within Functional  Limits for tasks assessed                                 General Comments: very pleasent          General Comments General comments (skin integrity, edema, etc.): very dry, cracked red skin especially on bilateral feet, VSS on 4L O2 via [place]       Pertinent Vitals/Pain Pain Assessment: 0-10 Faces Pain Scale: Hurts even more Pain Location: Cervical neck and low back Pain Descriptors / Indicators: Aching Pain Intervention(s): Limited activity within patient's tolerance;Monitored during session;Repositioned    Home Living Family/patient expects to be discharged to:: Assisted living             Home Equipment: Walker - 2 wheels;Wheelchair - power Additional Comments: Assist for wc transfers    Prior Function Level of Independence: Needs assistance  Gait / Transfers Assistance Needed: Use of RW for stand-pivot transfers to [facility] ADL's / Homemaking Assistance Needed: Recent assist with ADLs including bathing/dressing     PT Goals (current goals can now be found in the care plan section) Acute Rehab PT Goals Patient Stated Goal: To get better PT Goal Formulation: With patient Time For Goal Achievement: 11/07/19 Potential to Achieve Goals: Good Progress towards PT goals: Progressing toward goals    Frequency    Min 2X/week      PT Plan Current plan remains appropriate  AM-PAC PT "6 Clicks" Mobility   Outcome Measure  Help needed turning from your back to your side while in a flat bed without using bedrails?: Total Help needed moving from lying on your back to sitting on the side of a flat bed without using bedrails?: Total Help needed moving to and from a bed to a chair (including a wheelchair)?: Total Help needed standing up from a chair using your arms (e.g., wheelchair or bedside chair)?: Total Help needed to walk in hospital room?: Total Help needed climbing 3-5 steps with a railing? : Total 6 Click Score: 6    End of Session Equipment  Utilized During Treatment: Oxygen Activity Tolerance: Patient limited by fatigue;Patient limited by pain Patient left: with call bell/phone within reach;in chair;with chair alarm set;with nursing/sitter in room Nurse Communication: Mobility status PT Visit Diagnosis: Unsteadiness on feet (R26.81);Other abnormalities of gait and mobility (R26.89);Muscle weakness (generalized) (M62.81);Pain Pain - part of body:  (neck )     Time: 7989-2119 PT Time Calculation (min) (ACUTE ONLY): 29 min  Charges:  $Therapeutic Activity: 23-37 mins                     [name] PT, DPT Acute Rehabilitation Services Pager [phone] Office [phone]    [name] 11/03/2019, 2:16 PM

## 2019-11-04 ENCOUNTER — Encounter (HOSPITAL_COMMUNITY): Payer: Self-pay | Admitting: Internal Medicine

## 2019-11-04 LAB — CBC
HCT: 50.7 % (ref 39.0–52.0)
Hemoglobin: 15.2 g/dL (ref 13.0–17.0)
MCH: 27.6 pg (ref 26.0–34.0)
MCHC: 30 g/dL (ref 30.0–36.0)
MCV: 92 fL (ref 80.0–100.0)
Platelets: 224 10*3/uL (ref 150–400)
RBC: 5.51 MIL/uL (ref 4.22–5.81)
RDW: 12.9 % (ref 11.5–15.5)
WBC: 6.9 10*3/uL (ref 4.0–10.5)
nRBC: 0 % (ref 0.0–0.2)

## 2019-11-04 LAB — BASIC METABOLIC PANEL
Anion gap: 7 (ref 5–15)
BUN: 8 mg/dL (ref 8–23)
CO2: 34 mmol/L — ABNORMAL HIGH (ref 22–32)
Calcium: 8.5 mg/dL — ABNORMAL LOW (ref 8.9–10.3)
Chloride: 95 mmol/L — ABNORMAL LOW (ref 98–111)
Creatinine, Ser: 0.48 mg/dL — ABNORMAL LOW (ref 0.61–1.24)
GFR calc Af Amer: >60 mL/min (ref >60)
GFR calc non Af Amer: >60 mL/min (ref >60)
Glucose, Bld: 194 mg/dL — ABNORMAL HIGH (ref 70–99)
Potassium: 3.5 mmol/L (ref 3.5–5.1)
Sodium: 136 mmol/L (ref 135–145)

## 2019-11-04 LAB — GLUCOSE, CAPILLARY
Glucose-Capillary: 147 mg/dL — ABNORMAL HIGH (ref 70–99)
Glucose-Capillary: 205 mg/dL — ABNORMAL HIGH (ref 70–99)
Glucose-Capillary: 144 mg/dL — ABNORMAL HIGH (ref 70–99)
Glucose-Capillary: 185 mg/dL — ABNORMAL HIGH (ref 70–99)

## 2019-11-04 LAB — SARS CORONAVIRUS 2 BY RT PCR (HOSPITAL ORDER, PERFORMED IN ~~LOC~~ HOSPITAL LAB): SARS Coronavirus 2: NEGATIVE

## 2019-11-04 MED ORDER — FUROSEMIDE 20 MG PO TABS
20.0000 mg | ORAL_TABLET | Freq: Two times a day (BID) | ORAL | Status: DC
  Administered 2019-11-04 – 2019-11-05 (×3): 20 mg via ORAL
  Filled 2019-11-04 (×3): qty 1

## 2019-11-04 NOTE — Progress Notes (Signed)
PROGRESS NOTE Sergio Murphy  WER:[id number] DOB: Aug 23, 1955 DOA: 10/28/2019 PCP: Sergio Alken, MD  Chief Complaint  Patient presents with  . Shortness of Breath   Brief Narrative: 63yom with  history of depression, anxiety, hypertension, diabetes, chronic pain syndrome, chronic trach dependent respiratory failure discontinued about 2 years ago, cerebral palsy with seizure disorder and prior PEG placement admitted with shortness of breath for last several days and found to have HCAP sepsis respiratory failure needing BiPAP support, seen by PCCM and was admitted on 10/28/19. Patient was admitted treated with antibiotics and BiPAP for acute hypoxic and hypercapnic respiratory failure and  sepsis due to pneumonia. Culture with MRSA screen positive, respiratory sputum culture released, rare gram-positive cocci and culture grew moderate normal respiratory flora no staph or pseudomonas seen, also multiple species, blood culture no growth x5 days  Subjective: o2 down to 2l Sergio Murphy. Feels better. Taking his pills Has not been out of bed much he says due to short staff at facility. leg edematous  Assessment & Plan:  Sepsis due to pneumonia: Sputum culture normal flora, blood culture negative, urine culture mixed organism.  At this time hemodynamically stable afebrile, last WBC count 8.3 on 9/25.  Initially on vancomycin/cefepime then on vancomycin/Zosyn that was changed to Augmentin and completed antibiotic course 9/28.  Continue respiratory support, IS, ambulation PT OT  Acute on chronic respiratory failure with hypoxia/hypercapnia due to pneumonia.  History of trach dependent, removed 2 years ago.  If surgery is pursued however, wean oxygen as tolerated.  Patient will need BiPAP at bedtime.  Continue respiratory support. Wean o2- at 2l now .,reusme lasix  Leg swelling b/l chronic- resume home lasix  Cerebral palsy with hx of seizure disorder: History of trach and PEG dependency in the past.   Continues Keppra.  Continue supportive care fall precaution PT OT.  Depression with anxiety: Sertraline, pregabalin  Essential hypertension: Blood pressure is controlled on amlodipine  Diabetes mellitus type 2 in obese: Hemoglobin A1c poorly controlled 8.9, blood sugar stable on Lantus and sliding scale insulin. Recent Labs  Lab 11/03/19 1208 11/03/19 1633 11/03/19 2057 11/04/19 0817 11/04/19 1128  GLUCAP 158* 133* 161* 147* 205*   Morbid obesity with BMI 36: Will benefit with weight loss and healthy lifestyle  Chronic pain disorder: Continue pain management with oxycodone and pregabalin.   DNR, admitting MD discussed with patient and patient's sister they would not like a repeated tracheostomy and would not like to be resuscitated.  Physical deconditioning, worked with PT OT yesterday, max A x2 for rolling side-to-side to lift pad placement and had increased neck and back pain with movement.  DVT prophylaxis: enoxaparin (LOVENOX) injection 40 mg Start: 10/28/19 1815 Code Status:   Code Status: DNR  Family Communication: plan of care discussed with patient at bedside. Spoke with the bedside nursing staff.  Status is: Inpatient Remains inpatient appropriate because:Inpatient level of care appropriate due to severity of illness  Dispo: The patient is from: SNF [facility] long term              Anticipated d/c is to: SNF back to [facility]              Anticipated d/c date is: 1 day once respiratory status improves, continues to wean off o2 as tolerated              Patient currently is not medically stable to d/c. Nutrition: Diet Order  Diet Carb Modified Fluid consistency: Thin; Room service appropriate? Yes  Diet effective now                  Body mass index is 36.26 kg/m.  Consultants:see note  Procedures:see note Microbiology:see note Blood Culture    Component Value Date/Time   SDES SPU 10/30/2019 1119   SPECREQUEST NONE 10/30/2019 1119   CULT  10/30/2019 1119     MODERATE Normal respiratory flora-no Staph aureus or Pseudomonas seen Performed at [facility], [address], [place], [place] [address]    REPTSTATUS 11/01/2019 FINAL 10/30/2019 1119    Other culture-see note  Medications: Scheduled Meds: . acidophilus  2 capsule Oral Daily  . amLODipine  5 mg Oral Daily  . aspirin  81 mg Oral Daily  . docusate sodium  100 mg Oral BID  . enoxaparin (LOVENOX) injection  40 mg Subcutaneous Q24H  . famotidine  20 mg Oral BID  . furosemide  20 mg Oral BID  . influenza vac split quadrivalent PF  0.5 mL Intramuscular Tomorrow-1000  . insulin aspart  0-15 Units Subcutaneous TID WC  . insulin aspart  0-5 Units Subcutaneous QHS  . insulin glargine  5 Units Subcutaneous QHS  . levETIRAcetam  500 mg Oral BID  . naloxegol oxalate  25 mg Oral Daily  . oxyCODONE-acetaminophen  1 tablet Oral Q6H   And  . oxyCODONE  5 mg Oral Q6H  . pregabalin  150 mg Oral Q12H  . sertraline  75 mg Oral Daily  . sodium chloride flush  3 mL Intravenous Q12H  . tamsulosin  0.8 mg Oral Daily   Continuous Infusions:  Antimicrobials: Anti-infectives (From admission, onward)   Start     Dose/Rate Route Frequency Ordered Stop   11/02/19 1800  amoxicillin-clavulanate (AUGMENTIN) 875-125 MG per tablet 1 tablet        1 tablet Oral Every 12 hours 11/02/19 0739 11/03/19 2100   10/31/19 0900  ceFEPIme (MAXIPIME) 2 g in sodium chloride 0.9 % 100 mL IVPB  Status:  Discontinued        2 g 200 mL/hr over 30 Minutes Intravenous Every 8 hours 10/31/19 0737 11/02/19 0739   10/30/19 1400  piperacillin-tazobactam (ZOSYN) IVPB 3.375 g  Status:  Discontinued        3.375 g 100 mL/hr over 30 Minutes Intravenous Every 8 hours 10/30/19 1115 10/30/19 1117   10/30/19 1400  piperacillin-tazobactam (ZOSYN) IVPB 3.375 g  Status:  Discontinued        3.375 g 12.5 mL/hr over 240 Minutes Intravenous Every 8 hours 10/30/19 1117 10/31/19 0737   10/29/19 1645  vancomycin (VANCOREADY) IVPB  1250 mg/250 mL  Status:  Discontinued       "Followed by" Linked Group Details   1,250 mg 166.7 mL/hr over 90 Minutes Intravenous Every 24 hours 10/28/19 1640 11/02/19 0739   10/29/19 0100  ceFEPIme (MAXIPIME) 2 g in sodium chloride 0.9 % 100 mL IVPB  Status:  Discontinued        2 g 200 mL/hr over 30 Minutes Intravenous Every 8 hours 10/28/19 1640 10/30/19 1115   10/28/19 1645  vancomycin (VANCOREADY) IVPB 1500 mg/300 mL       "Followed by" Linked Group Details   1,500 mg 150 mL/hr over 120 Minutes Intravenous  Once 10/28/19 1640 10/28/19 2001   10/28/19 1615  vancomycin (VANCOCIN) IVPB 1000 mg/200 mL premix  Status:  Discontinued        1,000 mg  200 mL/hr over 60 Minutes Intravenous  Once 10/28/19 1601 10/28/19 1604   10/28/19 1615  ceFEPIme (MAXIPIME) 2 g in sodium chloride 0.9 % 100 mL IVPB        2 g 200 mL/hr over 30 Minutes Intravenous  Once 10/28/19 1601 10/28/19 1730     Objective: Vitals: Today's Vitals   11/03/19 1456 11/03/19 1500 11/03/19 2036 11/04/19 0500  BP: 102/60  (!) 142/75 (!) 154/89  Pulse: 78  82 78  Resp: 18  18 18   Temp: 97.8 F (36.6 C)  98.4 F (36.9 C) 97.8 F (36.6 C)  TempSrc: Oral  Oral Oral  SpO2: 100% 97% 96% 96%  Weight:    101.9 kg  Height:      PainSc:   0-No pain     Intake/Output Summary (Last 24 hours) at 11/04/2019 1133 Last data filed at 11/04/2019 0500 Gross per 24 hour  Intake --  Output 800 ml  Net -800 ml   Filed Weights   10/31/19 0452 11/03/19 0500 11/04/19 0500  Weight: 102.7 kg 101.1 kg 101.9 kg   Weight change: 0.839 kg  Intake/Output from previous day: 09/28 0701 - 09/29 0700 In: -  Out: 1000 [Urine:1000] Intake/Output this shift: No intake/output data recorded.  Examination: General exam: AA oriented, conversant, weak appearing  obese. HEENT:Oral mucosa moist, Ear/Nose WNL grossly,dentition normal. Respiratory system: bilaterally diminished,no use of accessory muscle, non tender. Cardiovascular system: S1 &  S2 +, regular, No JVD. Gastrointestinal system: Abdomen soft, NT,ND, BS+. Nervous System:Alert, awake, moving extremities and grossly nonfocal Extremities: b/l leg edema, distal peripheral pulses palpable.  Skin: No rashes,no icterus. MSK: Normal muscle bulk,tone, power  Data Reviewed: I have personally reviewed following labs and imaging studies CBC: Recent Labs  Lab 10/28/19 1510 10/28/19 1537 10/28/19 2314 10/29/19 0336 10/29/19 0748 10/30/19 0707 10/31/19 2336  WBC 14.6*  --   --  13.7*  --  8.9 8.3  NEUTROABS 12.5*  --   --   --   --   --  4.8  HGB 17.6*   < > 19.0* 17.5* 19.4* 16.4 16.2  HCT 58.7*   < > 56.0* 57.2* 57.0* 55.6* 53.9*  MCV 93.5  --   --  95.0  --  94.9 94.1  PLT 187  --   --  194  --  168 197   < > = values in this interval not displayed.   Basic Metabolic Panel: Recent Labs  Lab 10/29/19 0336 10/29/19 0336 10/29/19 0748 10/30/19 0707 10/31/19 0717 10/31/19 2336 11/01/19 0457  NA 137   < > 142 144 142 138 137  K 3.7   < > 3.5 3.5 3.6 2.7* 4.9  CL 96*  --   --  104 97* 94* 94*  CO2 29  --   --  30 32 32 32  GLUCOSE 232*  --   --  163* 220* 208* 173*  BUN 23  --   --  20 19 16 14   CREATININE 1.06  --   --  0.92 0.88 0.71 0.71  CALCIUM 8.2*  --   --  8.3* 8.5* 8.5* 8.3*  MG  --   --   --   --   --   --  1.7   < > = values in this interval not displayed.   GFR: Estimated Creatinine Clearance: 104.2 mL/min (by C-G formula based on SCr of 0.71 mg/dL). Liver Function Tests: Recent Labs  Lab 10/28/19 1510 10/30/19  1610 10/31/19 2336  AST 28 11* 8*  ALT ALKPHOS 103 94 88  BILITOT 1.3* 1.5* 1.4*  PROT 8.0 7.8 8.3*  ALBUMIN 3.1* 2.7* 2.9*   No results for input(s): LIPASE, AMYLASE in the last 168 hours. Recent Labs  Lab 10/31/19 2336  AMMONIA 48*   Coagulation Profile: Recent Labs  Lab 10/28/19 1530  INR 1.3*   Cardiac Enzymes: No results for input(s): CKTOTAL, CKMB, CKMBINDEX, TROPONINI in the last 168 hours. BNP (last 3  results) No results for input(s): PROBNP in the last 8760 hours. HbA1C: No results for input(s): HGBA1C in the last 72 hours. CBG: Recent Labs  Lab 11/03/19 1208 11/03/19 1633 11/03/19 2057 11/04/19 0817 11/04/19 1128  GLUCAP 158* 133* 161* 147* 205*   Lipid Profile: No results for input(s): CHOL, HDL, LDLCALC, TRIG, CHOLHDL, LDLDIRECT in the last 72 hours. Thyroid Function Tests: No results for input(s): TSH, T4TOTAL, FREET4, T3FREE, THYROIDAB in the last 72 hours. Anemia Panel: No results for input(s): VITAMINB12, FOLATE, FERRITIN, TIBC, IRON, RETICCTPCT in the last 72 hours. Sepsis Labs: Recent Labs  Lab 10/28/19 1510 10/28/19 1710 10/28/19 1917  PROCALCITON  --   --  2.18  LATICACIDVEN 2.1* 1.4  --     Recent Results (from the past 240 hour(s))  Blood culture (routine single)     Status: None   Collection Time: 10/28/19  3:10 PM   Specimen: BLOOD RIGHT FOREARM  Result Value Ref Range Status   Specimen Description BLOOD RIGHT FOREARM  Final   Special Requests   Final    BOTTLES DRAWN AEROBIC AND ANAEROBIC Blood Culture adequate volume   Culture   Final    NO GROWTH 5 DAYS Performed at [facility], [address], [place], [place] [address]    Report Status 11/02/2019 FINAL  Final  SARS Coronavirus 2 by RT PCR (hospital order, performed in [facility] hospital lab) Nasopharyngeal Nasopharyngeal Swab     Status: None   Collection Time: 10/28/19  3:28 PM   Specimen: Nasopharyngeal Swab  Result Value Ref Range Status   SARS Coronavirus 2 NEGATIVE NEGATIVE Final    Comment: (NOTE) SARS-CoV-2 target nucleic acids are NOT DETECTED.  The SARS-CoV-2 RNA is generally detectable in upper and lower respiratory specimens during the acute phase of infection. The lowest concentration of SARS-CoV-2 viral copies this assay can detect is 250 copies / mL. A negative result does not preclude SARS-CoV-2 infection and should not be used as the sole basis for treatment or  other patient management decisions.  A negative result may occur with improper specimen collection / handling, submission of specimen other than nasopharyngeal swab, presence of viral mutation(s) within the areas targeted by this assay, and inadequate number of viral copies (<250 copies / mL). A negative result must be combined with clinical observations, patient history, and epidemiological information.  Fact Sheet for Patients:   BoilerBrush.com.cy  Fact Sheet for Healthcare Providers: https://pope.com/  This test is not yet approved or  cleared by the [place] FDA and has been authorized for detection and/or diagnosis of SARS-CoV-2 by FDA under an Emergency Use Authorization (EUA).  This EUA will remain in effect (meaning this test can be used) for the duration of the COVID-19 declaration under Section 564(b)(1) of the Act, 21 U.S.C. section 360bbb-3(b)(1), unless the authorization is terminated or revoked sooner.  Performed at [facility], [address], [place], [place] [address]   Culture, blood (single)  Status: None   Collection Time: 10/28/19  4:46 PM   Specimen: BLOOD LEFT WRIST  Result Value Ref Range Status   Specimen Description BLOOD LEFT WRIST  Final   Special Requests   Final    BOTTLES DRAWN AEROBIC AND ANAEROBIC Blood Culture adequate volume   Culture   Final    NO GROWTH 5 DAYS Performed at [facility], [address] [place], [place] [address]    Report Status 11/02/2019 FINAL  Final  Urine culture     Status: Abnormal   Collection Time: 10/29/19  1:40 AM   Specimen: In/Out Cath Urine  Result Value Ref Range Status   Specimen Description IN/OUT CATH URINE  Final   Special Requests   Final    NONE Performed at [facility], [address] [place], [place] [address]    Culture MULTIPLE SPECIES PRESENT, SUGGEST RECOLLECTION (A)  Final   Report Status 10/29/2019 FINAL  Final   Culture, respiratory (non-expectorated)     Status: None   Collection Time: 10/30/19 11:19 AM   Specimen: Sputum; Respiratory  Result Value Ref Range Status   Specimen Description SPU  Final   Special Requests NONE  Final   Gram Stain   Final    FEW WBC PRESENT,BOTH PMN AND MONONUCLEAR RARE GRAM POSITIVE COCCI RARE YEAST    Culture   Final    MODERATE Normal respiratory flora-no Staph aureus or Pseudomonas seen Performed at [facility], [address], [place], [place] [address]    Report Status 11/01/2019 FINAL  Final  MRSA PCR Screening     Status: Abnormal   Collection Time: 10/30/19 12:01 PM   Specimen: Nasal Mucosa; Nasopharyngeal  Result Value Ref Range Status   MRSA by PCR POSITIVE (A) NEGATIVE Final    Comment:        The GeneXpert MRSA Assay (FDA approved for NASAL specimens only), is one component of a comprehensive MRSA colonization surveillance program. It is not intended to diagnose MRSA infection nor to guide or monitor treatment for MRSA infections. RESULT CALLED TO, READ BACK BY AND VERIFIED WITH: Kim RN 15:00 10/30/19 ([email or web address]) Performed at [facility], [address] [place], [place] [address]      Radiology Studies: No results found.   LOS: 7 days   Lanae Boast, MD [facility]  11/04/2019, 11:33 AM

## 2019-11-05 ENCOUNTER — Other Ambulatory Visit: Payer: Self-pay

## 2019-11-05 DIAGNOSIS — G809 Cerebral palsy, unspecified: Secondary | ICD-10-CM | POA: Diagnosis not present

## 2019-11-05 DIAGNOSIS — E1142 Type 2 diabetes mellitus with diabetic polyneuropathy: Secondary | ICD-10-CM | POA: Diagnosis not present

## 2019-11-05 DIAGNOSIS — J9621 Acute and chronic respiratory failure with hypoxia: Secondary | ICD-10-CM | POA: Diagnosis not present

## 2019-11-05 DIAGNOSIS — R293 Abnormal posture: Secondary | ICD-10-CM | POA: Diagnosis not present

## 2019-11-05 DIAGNOSIS — R0602 Shortness of breath: Secondary | ICD-10-CM | POA: Diagnosis not present

## 2019-11-05 DIAGNOSIS — L209 Atopic dermatitis, unspecified: Secondary | ICD-10-CM | POA: Diagnosis not present

## 2019-11-05 DIAGNOSIS — F331 Major depressive disorder, recurrent, moderate: Secondary | ICD-10-CM | POA: Diagnosis not present

## 2019-11-05 DIAGNOSIS — R062 Wheezing: Secondary | ICD-10-CM | POA: Diagnosis not present

## 2019-11-05 DIAGNOSIS — R5381 Other malaise: Secondary | ICD-10-CM | POA: Diagnosis not present

## 2019-11-05 DIAGNOSIS — Z7401 Bed confinement status: Secondary | ICD-10-CM | POA: Diagnosis not present

## 2019-11-05 DIAGNOSIS — G8220 Paraplegia, unspecified: Secondary | ICD-10-CM | POA: Diagnosis not present

## 2019-11-05 DIAGNOSIS — F419 Anxiety disorder, unspecified: Secondary | ICD-10-CM | POA: Diagnosis not present

## 2019-11-05 DIAGNOSIS — L22 Diaper dermatitis: Secondary | ICD-10-CM | POA: Diagnosis not present

## 2019-11-05 DIAGNOSIS — R059 Cough, unspecified: Secondary | ICD-10-CM | POA: Diagnosis not present

## 2019-11-05 DIAGNOSIS — E559 Vitamin D deficiency, unspecified: Secondary | ICD-10-CM | POA: Diagnosis not present

## 2019-11-05 DIAGNOSIS — G8929 Other chronic pain: Secondary | ICD-10-CM | POA: Diagnosis not present

## 2019-11-05 DIAGNOSIS — M2550 Pain in unspecified joint: Secondary | ICD-10-CM | POA: Diagnosis not present

## 2019-11-05 DIAGNOSIS — J449 Chronic obstructive pulmonary disease, unspecified: Secondary | ICD-10-CM | POA: Diagnosis not present

## 2019-11-05 DIAGNOSIS — J90 Pleural effusion, not elsewhere classified: Secondary | ICD-10-CM | POA: Diagnosis not present

## 2019-11-05 DIAGNOSIS — A419 Sepsis, unspecified organism: Secondary | ICD-10-CM | POA: Diagnosis not present

## 2019-11-05 DIAGNOSIS — Z9119 Patient's noncompliance with other medical treatment and regimen: Secondary | ICD-10-CM | POA: Diagnosis not present

## 2019-11-05 DIAGNOSIS — Z87440 Personal history of urinary (tract) infections: Secondary | ICD-10-CM | POA: Diagnosis not present

## 2019-11-05 DIAGNOSIS — D519 Vitamin B12 deficiency anemia, unspecified: Secondary | ICD-10-CM | POA: Diagnosis not present

## 2019-11-05 DIAGNOSIS — G894 Chronic pain syndrome: Secondary | ICD-10-CM | POA: Diagnosis not present

## 2019-11-05 DIAGNOSIS — E1140 Type 2 diabetes mellitus with diabetic neuropathy, unspecified: Secondary | ICD-10-CM | POA: Diagnosis not present

## 2019-11-05 DIAGNOSIS — I509 Heart failure, unspecified: Secondary | ICD-10-CM | POA: Diagnosis not present

## 2019-11-05 DIAGNOSIS — M6281 Muscle weakness (generalized): Secondary | ICD-10-CM | POA: Diagnosis not present

## 2019-11-05 DIAGNOSIS — I10 Essential (primary) hypertension: Secondary | ICD-10-CM | POA: Diagnosis not present

## 2019-11-05 DIAGNOSIS — K219 Gastro-esophageal reflux disease without esophagitis: Secondary | ICD-10-CM | POA: Diagnosis not present

## 2019-11-05 DIAGNOSIS — G4089 Other seizures: Secondary | ICD-10-CM | POA: Diagnosis not present

## 2019-11-05 DIAGNOSIS — N139 Obstructive and reflux uropathy, unspecified: Secondary | ICD-10-CM | POA: Diagnosis not present

## 2019-11-05 DIAGNOSIS — R0989 Other specified symptoms and signs involving the circulatory and respiratory systems: Secondary | ICD-10-CM | POA: Diagnosis not present

## 2019-11-05 DIAGNOSIS — F411 Generalized anxiety disorder: Secondary | ICD-10-CM | POA: Diagnosis not present

## 2019-11-05 DIAGNOSIS — R600 Localized edema: Secondary | ICD-10-CM | POA: Diagnosis not present

## 2019-11-05 DIAGNOSIS — N390 Urinary tract infection, site not specified: Secondary | ICD-10-CM | POA: Diagnosis not present

## 2019-11-05 DIAGNOSIS — M62838 Other muscle spasm: Secondary | ICD-10-CM | POA: Diagnosis not present

## 2019-11-05 DIAGNOSIS — J189 Pneumonia, unspecified organism: Secondary | ICD-10-CM | POA: Diagnosis not present

## 2019-11-05 DIAGNOSIS — R0902 Hypoxemia: Secondary | ICD-10-CM | POA: Diagnosis not present

## 2019-11-05 DIAGNOSIS — E1165 Type 2 diabetes mellitus with hyperglycemia: Secondary | ICD-10-CM | POA: Diagnosis not present

## 2019-11-05 LAB — GLUCOSE, CAPILLARY: Glucose-Capillary: 158 mg/dL — ABNORMAL HIGH (ref 70–99)

## 2019-11-05 NOTE — Discharge Summary (Signed)
Physician Discharge Summary  Sergio Murphy ZOX:[id number] DOB: May 09, 1955 DOA: 10/28/2019  PCP: Shayne Alken, MD  Admit date: 10/28/2019 Discharge date: 11/05/2019  Admitted From: NH  Disposition:  NH  Recommendations for Outpatient Follow-up:  1. Follow up with PCP in 1-2 weeks 2. Please obtain BMP/CBC in one week 3. Please follow up on the following pending results:  Home Health:NO  Equipment/Devices: NONE  Discharge Condition: Stable Code Status:   Code Status: DNR Diet recommendation:  Diet Order            Diet - low sodium heart healthy           Diet Carb Modified Fluid consistency: Thin; Room service appropriate? Yes  Diet effective now                 Brief/Interim Summary: 63yom with  history of depression, anxiety, hypertension, diabetes, chronic pain syndrome, chronic trach dependent respiratory failure discontinued about 2 years ago, cerebral palsy with seizure disorder and prior PEG placement admitted with shortness of breath for last several days and found to have HCAP sepsis respiratory failure needing BiPAP support, seen by PCCM and was admitted on 10/28/19. Patient was admitted treated with antibiotics and BiPAP for acute hypoxic and hypercapnic respiratory failure and  sepsis due to pneumonia. Culture with MRSA screen positive, respiratory sputum culture released, rare gram-positive cocci and culture grew moderate normal respiratory flora no staph or pseudomonas seen, also multiple species, blood culture no growth x5 days. He has been doing well on 2l [place] now.Has not used BIPAP X4 days and does not like it either.Wanting to stay off of o2 but agreeable with 2l [place] after I explained him. Patient is medically stable for discharge to facility today  Discharge Diagnoses:  Assessment & Plan:  Sepsis due to pneumonia: Sputum culture normal flora, blood culture negative, urine culture mixed organism.  At this time hemodynamically stable afebrile, last WBC  count 8.3 on 9/25.  Initially on vancomycin/cefepime then on vancomycin/Zosyn that was changed to Augmentin and completed antibiotic course 9/28.    Medically stable.  Continue PT OT at [facility].  Acute on chronic respiratory failure with hypoxia/hypercapnia due to pneumonia.  History of trach dependent, removed 2 years ago. He has been doing well on 2l [place] now.Has not used BIPAP X4 days and does not like it either.Wanting to stay off of o2 but agreeable with 2l [place] after I explained him.  Leg swelling b/l chronic-continue on home Lasix.  Cerebral palsy with hx of seizure disorder: History of trach and PEG dependency in the past.  Continue HIS HOME Keppra.  Continue supportive care fall precaution PT OT.  Depression with anxiety: Sertraline, pregabalin to be continued at her nursing home  Essential hypertension:  Controlled on amlodipine  Diabetes mellitus type 2 in obese: Hemoglobin A1c poorly controlled 8.9, blood sugar stable on Lantus and sliding scale insulin.  Morbid obesity with BMI 36: Will benefit with weight loss and healthy lifestyle  Chronic pain disorder: Continue pain management with oxycodone and pregabalin.   DNR, admitting MD discussed with patient and patient's sister they would not like a repeated tracheostomy and would not like to be resuscitated.  Physical deconditioning, worked with PT OT yesterday, max A x2 for rolling side-to-side to lift pad placement and had increased neck and back pain with movement.  Consults:  pt Subjective: On 2 L nasal cannula.  Feels fine did not use BiPAP last night and has not been doing for  last 4 days.  Agree for discharge to nursing home today.  Discharge Exam: Vitals:   11/05/19 0500 11/05/19 0959  BP: 120/71 (!) 149/66  Pulse: 81   Resp: 18   Temp: 98.3 F (36.8 C)   SpO2: 91%    General: Pt is alert, awake, not in acute distress Cardiovascular: RRR, S1/S2 +, no rubs, no gallops Respiratory: CTA bilaterally, no wheezing,  no rhonchi Abdominal: Soft, NT, ND, bowel sounds + Extremities: no edema, no cyanosis  Discharge Instructions  Discharge Instructions    Diet - low sodium heart healthy   Complete by: As directed    Discharge instructions   Complete by: As directed    Please call call MD or return to ER for similar or worsening recurring problem that brought you to hospital or if any fever,nausea/vomiting,abdominal pain, uncontrolled pain, chest pain,  shortness of breath or any other alarming symptoms.  Please follow-up your doctor as instructed in a week time and call the office for appointment.  Please avoid alcohol, smoking, or any other illicit substance and maintain healthy habits including taking your regular medications as prescribed.  You were cared for by a hospitalist during your hospital stay. If you have any questions about your discharge medications or the care you received while you were in the hospital after you are discharged, you can call the unit and ask to speak with the hospitalist on call if the hospitalist that took care of you is not available.  Once you are discharged, your primary care physician will handle any further medical issues. Please note that NO REFILLS for any discharge medications will be authorized once you are discharged, as it is imperative that you return to your primary care physician (or establish a relationship with a primary care physician if you do not have one) for your aftercare needs so that they can reassess your need for medications and monitor your lab values   Increase activity slowly   Complete by: As directed      Allergies as of 11/05/2019      Reactions   Latex Swelling   Benazepril Other (See Comments)   AKI and hyperkalemia   Carbamazepine Other (See Comments)   Reaction not listed on MAR      Medication List    TAKE these medications   acetaminophen 325 MG tablet Commonly known as: TYLENOL Take 650 mg by mouth every 6 (six) hours as  needed for moderate pain.   amLODipine 5 MG tablet Commonly known as: NORVASC Take 5 mg by mouth daily.   Aspirin 81 81 MG chewable tablet Generic drug: aspirin Chew 81 mg by mouth daily.   Baclofen 5 MG Tabs Take 10 mg by mouth 3 (three) times daily.   diazepam 5 MG tablet Commonly known as: VALIUM Take 5 mg by mouth 3 (three) times daily.   docusate sodium 100 MG capsule Commonly known as: COLACE Take 100 mg by mouth 2 (two) times daily.   famotidine 20 MG tablet Commonly known as: PEPCID Take 20 mg by mouth 2 (two) times daily.   furosemide 20 MG tablet Commonly known as: LASIX Take 20 mg by mouth 2 (two) times daily.   hydrocortisone cream 1 % Apply 1 application topically 3 (three) times daily.   levETIRAcetam 500 MG tablet Commonly known as: KEPPRA Take 500 mg by mouth 2 (two) times daily.   Movantik 25 MG Tabs tablet Generic drug: naloxegol oxalate Take 25 mg by mouth daily.  oxyCODONE-acetaminophen 5-325 MG tablet Commonly known as: PERCOCET/ROXICET Take 1 tablet by mouth every 12 (twelve) hours as needed for severe pain. What changed: Another medication with the same name was removed. Continue taking this medication, and follow the directions you see here.   potassium chloride SA 20 MEQ tablet Commonly known as: KLOR-CON Take 20 mEq by mouth 2 (two) times daily.   pregabalin 150 MG capsule Commonly known as: LYRICA Take 150 mg by mouth every 12 (twelve) hours.   Probiotic Caps Take 2 capsules by mouth daily.   tamsulosin 0.4 MG Caps capsule Commonly known as: FLOMAX Take 0.8 mg by mouth daily.   Trulicity 0.75 MG/0.5ML Sopn Generic drug: Dulaglutide Inject 0.75 mg into the skin once a week. Take on Saturday   vitamin B-12 1000 MCG tablet Commonly known as: CYANOCOBALAMIN Take 1,000 mcg by mouth daily.   Vitamin D 50 MCG (2000 UT) tablet Take 2,000 Units by mouth daily.   zinc oxide 20 % ointment Apply 1 application topically in the  morning, at noon, and at bedtime.   Zoloft 50 MG tablet Generic drug: sertraline Take 75 mg by mouth daily.       Allergies  Allergen Reactions  . Latex Swelling  . Benazepril Other (See Comments)    AKI and hyperkalemia  . Carbamazepine Other (See Comments)    Reaction not listed on MAR    The results of significant diagnostics from this hospitalization (including imaging, microbiology, ancillary and laboratory) are listed below for reference.    Microbiology: Recent Results (from the past 240 hour(s))  Blood culture (routine single)     Status: None   Collection Time: 10/28/19  3:10 PM   Specimen: BLOOD RIGHT FOREARM  Result Value Ref Range Status   Specimen Description BLOOD RIGHT FOREARM  Final   Special Requests   Final    BOTTLES DRAWN AEROBIC AND ANAEROBIC Blood Culture adequate volume   Culture   Final    NO GROWTH 5 DAYS Performed at [facility], [address], [place], [place] [address]    Report Status 11/02/2019 FINAL  Final  SARS Coronavirus 2 by RT PCR (hospital order, performed in [facility] hospital lab) Nasopharyngeal Nasopharyngeal Swab     Status: None   Collection Time: 10/28/19  3:28 PM   Specimen: Nasopharyngeal Swab  Result Value Ref Range Status   SARS Coronavirus 2 NEGATIVE NEGATIVE Final    Comment: (NOTE) SARS-CoV-2 target nucleic acids are NOT DETECTED.  The SARS-CoV-2 RNA is generally detectable in upper and lower respiratory specimens during the acute phase of infection. The lowest concentration of SARS-CoV-2 viral copies this assay can detect is 250 copies / mL. A negative result does not preclude SARS-CoV-2 infection and should not be used as the sole basis for treatment or other patient management decisions.  A negative result may occur with improper specimen collection / handling, submission of specimen other than nasopharyngeal swab, presence of viral mutation(s) within the areas targeted by this assay, and inadequate  number of viral copies (<250 copies / mL). A negative result must be combined with clinical observations, patient history, and epidemiological information.  Fact Sheet for Patients:   BoilerBrush.com.cy  Fact Sheet for Healthcare Providers: https://pope.com/  This test is not yet approved or  cleared by the [place] FDA and has been authorized for detection and/or diagnosis of SARS-CoV-2 by FDA under an Emergency Use Authorization (EUA).  This EUA will remain in effect (meaning this test  can be used) for the duration of the COVID-19 declaration under Section 564(b)(1) of the Act, 21 U.S.C. section 360bbb-3(b)(1), unless the authorization is terminated or revoked sooner.  Performed at [facility], [address], [place], [place] [address]   Culture, blood (single)     Status: None   Collection Time: 10/28/19  4:46 PM   Specimen: BLOOD LEFT WRIST  Result Value Ref Range Status   Specimen Description BLOOD LEFT WRIST  Final   Special Requests   Final    BOTTLES DRAWN AEROBIC AND ANAEROBIC Blood Culture adequate volume   Culture   Final    NO GROWTH 5 DAYS Performed at [facility], [address] [place], [place] [address]    Report Status 11/02/2019 FINAL  Final  Urine culture     Status: Abnormal   Collection Time: 10/29/19  1:40 AM   Specimen: In/Out Cath Urine  Result Value Ref Range Status   Specimen Description IN/OUT CATH URINE  Final   Special Requests   Final    NONE Performed at [facility], [address] [place], [place] [address]    Culture MULTIPLE SPECIES PRESENT, SUGGEST RECOLLECTION (A)  Final   Report Status 10/29/2019 FINAL  Final  Culture, respiratory (non-expectorated)     Status: None   Collection Time: 10/30/19 11:19 AM   Specimen: Sputum; Respiratory  Result Value Ref Range Status   Specimen Description SPU  Final   Special Requests NONE  Final   Gram Stain   Final    FEW  WBC PRESENT,BOTH PMN AND MONONUCLEAR RARE GRAM POSITIVE COCCI RARE YEAST    Culture   Final    MODERATE Normal respiratory flora-no Staph aureus or Pseudomonas seen Performed at [facility], [address], [place], [place] [address]    Report Status 11/01/2019 FINAL  Final  MRSA PCR Screening     Status: Abnormal   Collection Time: 10/30/19 12:01 PM   Specimen: Nasal Mucosa; Nasopharyngeal  Result Value Ref Range Status   MRSA by PCR POSITIVE (A) NEGATIVE Final    Comment:        The GeneXpert MRSA Assay (FDA approved for NASAL specimens only), is one component of a comprehensive MRSA colonization surveillance program. It is not intended to diagnose MRSA infection nor to guide or monitor treatment for MRSA infections. RESULT CALLED TO, READ BACK BY AND VERIFIED WITH: Kim RN 15:00 10/30/19 ([email or web address]) Performed at [facility], [address], [place], [place] [address]   SARS Coronavirus 2 by RT PCR (hospital order, performed in [facility] hospital lab) Nasopharyngeal Nasopharyngeal Swab     Status: None   Collection Time: 11/04/19  2:49 PM   Specimen: Nasopharyngeal Swab  Result Value Ref Range Status   SARS Coronavirus 2 NEGATIVE NEGATIVE Final    Comment: (NOTE) SARS-CoV-2 target nucleic acids are NOT DETECTED.  The SARS-CoV-2 RNA is generally detectable in upper and lower respiratory specimens during the acute phase of infection. The lowest concentration of SARS-CoV-2 viral copies this assay can detect is 250 copies / mL. A negative result does not preclude SARS-CoV-2 infection and should not be used as the sole basis for treatment or other patient management decisions.  A negative result may occur with improper specimen collection / handling, submission of specimen other than nasopharyngeal swab, presence of viral mutation(s) within the areas targeted by this assay, and inadequate number of viral copies (<250 copies / mL).  A negative result must be  combined with clinical observations, patient history, and epidemiological information.  Fact Sheet for Patients:   BoilerBrush.com.cy  Fact Sheet for Healthcare Providers: https://pope.com/  This test is not yet approved or  cleared by the [place] FDA and has been authorized for detection and/or diagnosis of SARS-CoV-2 by FDA under an Emergency Use Authorization (EUA).  This EUA will remain in effect (meaning this test can be used) for the duration of the COVID-19 declaration under Section 564(b)(1) of the Act, 21 U.S.C. section 360bbb-3(b)(1), unless the authorization is terminated or revoked sooner.  Performed at [facility], [address], [place], [place] [address]     Procedures/Studies: CT CHEST WO CONTRAST  Result Date: 10/29/2019 CLINICAL DATA:  Cough, altered mental status, respiratory failure EXAM: CT CHEST WITHOUT CONTRAST TECHNIQUE: Multidetector CT imaging of the chest was performed following the standard protocol without IV contrast. COMPARISON:  None FINDINGS: Cardiovascular: Extensive multi-vessel coronary artery calcification. Global cardiac size within normal limits. No pericardial effusion. Central pulmonary arteries are enlarged in keeping with changes of pulmonary arterial hypertension. Thoracic aorta is of normal caliber. Moderate scattered atherosclerotic calcification is seen within the thoracic aorta. Mediastinum/Nodes: No enlarged mediastinal or axillary lymph nodes. Thyroid gland, trachea, and esophagus demonstrate no significant findings. Lungs/Pleura: There is extensive consolidation involving the basilar lingula and left lower lobe as well as airspace infiltrate throughout the aerated left upper lobe in keeping with changes of acute lobar pneumonia. Mild right basilar atelectasis. Benign calcified granuloma within the right mid lung zone. Mild biapical paraseptal emphysema. Linear opacity within the  right apex is not well assessed on this examination due to motion artifact. This is best seen on axial image # 44/7. No pneumothorax. No central obstructing mass. Upper Abdomen: Mild elevation of the left hemidiaphragm is present. Limited images of the upper abdomen are otherwise unremarkable. Musculoskeletal: No acute bone abnormality. IMPRESSION: Extensive left lung consolidation in keeping with changes of acute lobar pneumonia. No central obstructing mass. Indeterminate linear opacity within the right apex. This could be reassessed with repeat examination in 3 months once the patient's acute issues have resolved. Extensive coronary artery calcification. Pulmonary arterial hypertension. Aortic Atherosclerosis (ICD10-I70.0) and Emphysema (ICD10-J43.9). Electronically Signed   By: Helyn Numbers MD   On: 10/29/2019 03:18   DG Chest Port 1 View  Result Date: 10/28/2019 CLINICAL DATA:  Possible sepsis. EXAM: PORTABLE CHEST 1 VIEW COMPARISON:  Chest x-ray dated November 19, 2017. FINDINGS: The patient is rotated to the left. Cardiomegaly. Increased reticulonodular interstitial thickening at the right lung base. Moderate left pleural effusion with dense retrocardiac opacity. No pneumothorax. Unchanged calcified granuloma at the right lung base. No acute osseous abnormality. IMPRESSION: 1. Moderate left pleural effusion with left basilar atelectasis versus infiltrate. 2. Increased reticulonodular interstitial thickening at the right lung base may be infectious or inflammatory. Electronically Signed   By: Obie Dredge M.D.   On: 10/28/2019 15:40    Labs: BNP (last 3 results) No results for input(s): BNP in the last 8760 hours. Basic Metabolic Panel: Recent Labs  Lab 10/30/19 0707 10/31/19 0717 10/31/19 2336 11/01/19 0457 11/04/19 1156  NA 144 142 138 137 136  K 3.5 3.6 2.7* 4.9 3.5  CL 104 97* 94* 94* 95*  CO2 30 32 32 32 34*  GLUCOSE 163* 220* 208* 173* 194*  BUN 20 19 16 14 8   CREATININE 0.92  0.88 0.71 0.71 0.48*  CALCIUM 8.3* 8.5* 8.5* 8.3* 8.5*  MG  --   --   --  1.7  --    Liver Function Tests: Recent Labs  Lab 10/30/19 0707 10/31/19 2336  AST 11* 8*  ALT 12 11  ALKPHOS 94 88  BILITOT 1.5* 1.4*  PROT 7.8 8.3*  ALBUMIN 2.7* 2.9*   No results for input(s): LIPASE, AMYLASE in the last 168 hours. Recent Labs  Lab 10/31/19 2336  AMMONIA 48*   CBC: Recent Labs  Lab 10/30/19 0707 10/31/19 2336 11/04/19 1156  WBC 8.9 8.3 6.9  NEUTROABS  --  4.8  --   HGB 16.4 16.2 15.2  HCT 55.6* 53.9* 50.7  MCV 94.9 94.1 92.0  PLT 168 197 224   Cardiac Enzymes: No results for input(s): CKTOTAL, CKMB, CKMBINDEX, TROPONINI in the last 168 hours. BNP: Invalid input(s): POCBNP CBG: Recent Labs  Lab 11/04/19 0817 11/04/19 1128 11/04/19 1621 11/04/19 2132 11/05/19 0904  GLUCAP 147* 205* 144* 185* 158*   D-Dimer No results for input(s): DDIMER in the last 72 hours. Hgb A1c No results for input(s): HGBA1C in the last 72 hours. Lipid Profile No results for input(s): CHOL, HDL, LDLCALC, TRIG, CHOLHDL, LDLDIRECT in the last 72 hours. Thyroid function studies No results for input(s): TSH, T4TOTAL, T3FREE, THYROIDAB in the last 72 hours.  Invalid input(s): FREET3 Anemia work up No results for input(s): VITAMINB12, FOLATE, FERRITIN, TIBC, IRON, RETICCTPCT in the last 72 hours. Urinalysis    Component Value Date/Time   COLORURINE YELLOW 10/29/2019 0140   APPEARANCEUR CLEAR 10/29/2019 0140   LABSPEC 1.011 10/29/2019 0140   PHURINE 7.0 10/29/2019 0140   GLUCOSEU NEGATIVE 10/29/2019 0140   HGBUR LARGE (A) 10/29/2019 0140   BILIRUBINUR NEGATIVE 10/29/2019 0140   KETONESUR NEGATIVE 10/29/2019 0140   PROTEINUR 30 (A) 10/29/2019 0140   NITRITE NEGATIVE 10/29/2019 0140   LEUKOCYTESUR LARGE (A) 10/29/2019 0140   Sepsis Labs Invalid input(s): PROCALCITONIN,  WBC,  LACTICIDVEN Microbiology Recent Results (from the past 240 hour(s))  Blood culture (routine single)      Status: None   Collection Time: 10/28/19  3:10 PM   Specimen: BLOOD RIGHT FOREARM  Result Value Ref Range Status   Specimen Description BLOOD RIGHT FOREARM  Final   Special Requests   Final    BOTTLES DRAWN AEROBIC AND ANAEROBIC Blood Culture adequate volume   Culture   Final    NO GROWTH 5 DAYS Performed at [facility], [address] [place], [place] [address]    Report Status 11/02/2019 FINAL  Final  SARS Coronavirus 2 by RT PCR (hospital order, performed in [facility] hospital lab) Nasopharyngeal Nasopharyngeal Swab     Status: None   Collection Time: 10/28/19  3:28 PM   Specimen: Nasopharyngeal Swab  Result Value Ref Range Status   SARS Coronavirus 2 NEGATIVE NEGATIVE Final    Comment: (NOTE) SARS-CoV-2 target nucleic acids are NOT DETECTED.  The SARS-CoV-2 RNA is generally detectable in upper and lower respiratory specimens during the acute phase of infection. The lowest concentration of SARS-CoV-2 viral copies this assay can detect is 250 copies / mL. A negative result does not preclude SARS-CoV-2 infection and should not be used as the sole basis for treatment or other patient management decisions.  A negative result may occur with improper specimen collection / handling, submission of specimen other than nasopharyngeal swab, presence of viral mutation(s) within the areas targeted by this assay, and inadequate number of viral copies (<250 copies / mL). A negative result must be  combined with clinical observations, patient history, and epidemiological information.  Fact Sheet for Patients:   BoilerBrush.com.cy  Fact Sheet for Healthcare Providers: https://pope.com/  This test is not yet approved or  cleared by the [place] FDA and has been authorized for detection and/or diagnosis of SARS-CoV-2 by FDA under an Emergency Use Authorization (EUA).  This EUA will remain in effect (meaning this test can be used)  for the duration of the COVID-19 declaration under Section 564(b)(1) of the Act, 21 U.S.C. section 360bbb-3(b)(1), unless the authorization is terminated or revoked sooner.  Performed at [facility], [address] [place], [place] [address]   Culture, blood (single)     Status: None   Collection Time: 10/28/19  4:46 PM   Specimen: BLOOD LEFT WRIST  Result Value Ref Range Status   Specimen Description BLOOD LEFT WRIST  Final   Special Requests   Final    BOTTLES DRAWN AEROBIC AND ANAEROBIC Blood Culture adequate volume   Culture   Final    NO GROWTH 5 DAYS Performed at [facility], [address] [place], [place] [address]    Report Status 11/02/2019 FINAL  Final  Urine culture     Status: Abnormal   Collection Time: 10/29/19  1:40 AM   Specimen: In/Out Cath Urine  Result Value Ref Range Status   Specimen Description IN/OUT CATH URINE  Final   Special Requests   Final    NONE Performed at [facility], [address], [place], [place] [address]    Culture MULTIPLE SPECIES PRESENT, SUGGEST RECOLLECTION (A)  Final   Report Status 10/29/2019 FINAL  Final  Culture, respiratory (non-expectorated)     Status: None   Collection Time: 10/30/19 11:19 AM   Specimen: Sputum; Respiratory  Result Value Ref Range Status   Specimen Description SPU  Final   Special Requests NONE  Final   Gram Stain   Final    FEW WBC PRESENT,BOTH PMN AND MONONUCLEAR RARE GRAM POSITIVE COCCI RARE YEAST    Culture   Final    MODERATE Normal respiratory flora-no Staph aureus or Pseudomonas seen Performed at [facility], [address], [place], [place] [address]    Report Status 11/01/2019 FINAL  Final  MRSA PCR Screening     Status: Abnormal   Collection Time: 10/30/19 12:01 PM   Specimen: Nasal Mucosa; Nasopharyngeal  Result Value Ref Range Status   MRSA by PCR POSITIVE (A) NEGATIVE Final    Comment:        The GeneXpert MRSA Assay (FDA approved for NASAL  specimens only), is one component of a comprehensive MRSA colonization surveillance program. It is not intended to diagnose MRSA infection nor to guide or monitor treatment for MRSA infections. RESULT CALLED TO, READ BACK BY AND VERIFIED WITH: Kim RN 15:00 10/30/19 ([email or web address]) Performed at [facility], [address], [place], [place] [address]   SARS Coronavirus 2 by RT PCR (hospital order, performed in [facility] hospital lab) Nasopharyngeal Nasopharyngeal Swab     Status: None   Collection Time: 11/04/19  2:49 PM   Specimen: Nasopharyngeal Swab  Result Value Ref Range Status   SARS Coronavirus 2 NEGATIVE NEGATIVE Final    Comment: (NOTE) SARS-CoV-2 target nucleic acids are NOT DETECTED.  The SARS-CoV-2 RNA is generally detectable in upper and lower respiratory specimens during the acute phase of infection. The lowest concentration of SARS-CoV-2 viral copies this assay can detect is  250 copies / mL. A negative result does not preclude SARS-CoV-2 infection and should not be used as the sole basis for treatment or other patient management decisions.  A negative result may occur with improper specimen collection / handling, submission of specimen other than nasopharyngeal swab, presence of viral mutation(s) within the areas targeted by this assay, and inadequate number of viral copies (<250 copies / mL). A negative result must be combined with clinical observations, patient history, and epidemiological information.  Fact Sheet for Patients:   BoilerBrush.com.cy  Fact Sheet for Healthcare Providers: https://pope.com/  This test is not yet approved or  cleared by the [place] FDA and has been authorized for detection and/or diagnosis of SARS-CoV-2 by FDA under an Emergency Use Authorization (EUA).  This EUA will remain in effect (meaning this test can be used) for the duration of the COVID-19 declaration under Section  564(b)(1) of the Act, 21 U.S.C. section 360bbb-3(b)(1), unless the authorization is terminated or revoked sooner.  Performed at [facility], [address], [place], [place] [address]      Time coordinating discharge: 35  minutes  SIGNED: Lanae Boast, MD  [facility] 11/05/2019, 11:18 AM  If 7PM-7AM, please contact night-coverage [email or web address]

## 2019-11-05 NOTE — Progress Notes (Signed)
Report given to Mariel Aloe, receiving RN at [facility]. Patient has just arrived to facility. All questions answered.

## 2019-11-05 NOTE — Progress Notes (Signed)
Physical Therapy Treatment Patient Details Name: Sergio Murphy MRN: [id number] DOB: 05-Sep-1955 Today's Date: 11/05/2019    History of Present Illness 64 y.o. male presenting with sepsis due to HCAP with acute on chronic respiratory failure. PMHx significant for depression/anxiety, HTN, DM, chronic pain syndrome, prior chronic trach-dependent respiratory failure (discontinued about 2 years ago), CP with seizure disorder and prior PEG placement.    PT Comments    Pt awaiting transfer back to [facility], agreeable to working with therapy. Pt found bed and gown found to be wet due to malfunction of urine collection system. Pt able to assist with reaching toward contralateral bed rail, requiring maxAx2 for rolling to clean. Pt requires total A for coming to EoB. Able to sit for 11 minutes working on seated balance. Pt requiring between total to mod A for balance except for 2x independent sitting for 5 seconds. Pt with increased fatigue and requires total A for return to bed. PT will continue to see acutely until discharge    Follow Up Recommendations  SNF     Equipment Recommendations  None recommended by PT    Recommendations for Other Services      Precautions / Restrictions Precautions Precautions: None    Mobility  Bed Mobility Overal bed mobility: Needs Assistance Bed Mobility: Sit to Supine;Supine to Sit;Rolling Rolling: Max assist;+2 for physical assistance   Supine to sit: Total assist Sit to supine: Total assist   General bed mobility comments: Pt urine collection system had leaked and pt requires maxAx2 for rolling able to reach toward rail to assist for placement of clean pad and gown. Total A to come to seated EoB, and for returning to bed        Balance Overall balance assessment: Needs assistance Sitting-balance support: Bilateral upper extremity supported;Feet unsupported Sitting balance-Leahy Scale: Zero Sitting balance - Comments: requires outside  assist to maintain seated balance able to sit with out assist for 2 bouts of 5 sec during session                                     Cognition Arousal/Alertness: Awake/alert Behavior During Therapy: [facility] for tasks assessed/performed Overall Cognitive Status: Within Functional Limits for tasks assessed                                           General Comments General comments (skin integrity, edema, etc.): VSS on 4L O2 via [place], pt noted to have blanchable purple/redness on bottom, NT notifed and rotated off sacrum at end of session       Pertinent Vitals/Pain Pain Assessment: Faces Faces Pain Scale: Hurts little more Pain Location: Cervical neck and low back Pain Descriptors / Indicators: Aching Pain Intervention(s): Limited activity within patient's tolerance;Monitored during session;Repositioned           PT Goals (current goals can now be found in the care plan section) Acute Rehab PT Goals Patient Stated Goal: To get better PT Goal Formulation: With patient Time For Goal Achievement: 11/07/19 Potential to Achieve Goals: Good Progress towards PT goals: Progressing toward goals    Frequency    Min 2X/week      PT Plan Current plan remains appropriate       AM-PAC PT "6 Clicks" Mobility   Outcome Measure  Help needed  turning from your back to your side while in a flat bed without using bedrails?: Total Help needed moving from lying on your back to sitting on the side of a flat bed without using bedrails?: Total Help needed moving to and from a bed to a chair (including a wheelchair)?: Total Help needed standing up from a chair using your arms (e.g., wheelchair or bedside chair)?: Total Help needed to walk in hospital room?: Total Help needed climbing 3-5 steps with a railing? : Total 6 Click Score: 6    End of Session Equipment Utilized During Treatment: Oxygen Activity Tolerance: Patient tolerated treatment well Patient left:  with call bell/phone within reach;in chair;with chair alarm set;with nursing/sitter in room Nurse Communication: Mobility status PT Visit Diagnosis: Unsteadiness on feet (R26.81);Other abnormalities of gait and mobility (R26.89);Muscle weakness (generalized) (M62.81) Pain - part of body:  (neck )     Time: 3086-5784 PT Time Calculation (min) (ACUTE ONLY): 23 min  Charges:  $Therapeutic Activity: 23-37 mins                     Abigial Newville B. Beverely Risen PT, DPT Acute Rehabilitation Services Pager [phone] Office [phone]    [name] 11/05/2019, 12:27 PM

## 2019-11-05 NOTE — TOC Transition Note (Signed)
Transition of Care [facility]) - CM/SW Discharge Note   Patient Details  Name: Sergio Murphy MRN: [id number] Date of Birth: 1955-12-17  Transition of Care [facility] CM/SW Contact:  Terrial Rhodes, LCSWA Phone Number: 11/05/2019, 11:40 AM   Clinical Narrative:     Patient will DC to: [facility]  Anticipated DC date: 11/05/2019  Family notified: Terri  Transport by: Sharin Mons  ?  Per MD patient ready for DC to [facility] . RN, patient, patient's family, and facility notified of DC. Discharge Summary sent to facility. RN given number for report tele# [phone] RM# [phone]. DC packet on chart. Ambulance transport requested for patient.  CSW signing off.   Final next level of care: Skilled Nursing Facility Barriers to Discharge: No Barriers Identified   Patient Goals and CMS Choice Patient states their goals for this hospitalization and ongoing recovery are:: to go to [facility] CMS Medicare.gov Compare Post Acute Care list provided to:: Patient Choice offered to / list presented to : Patient  Discharge Placement              Patient chooses bed at: [facility] Patient to be transferred to facility by: [facility] Name of family member notified: Terri Patient and family notified of of transfer: 11/05/19  Discharge Plan and Services                                     Social Determinants of Health (SDOH) Interventions     Readmission Risk Interventions No flowsheet data found.

## 2019-11-12 DIAGNOSIS — J9621 Acute and chronic respiratory failure with hypoxia: Secondary | ICD-10-CM | POA: Diagnosis not present

## 2019-11-12 DIAGNOSIS — E1142 Type 2 diabetes mellitus with diabetic polyneuropathy: Secondary | ICD-10-CM | POA: Diagnosis not present

## 2019-11-12 DIAGNOSIS — I10 Essential (primary) hypertension: Secondary | ICD-10-CM | POA: Diagnosis not present

## 2019-11-12 DIAGNOSIS — G809 Cerebral palsy, unspecified: Secondary | ICD-10-CM | POA: Diagnosis not present

## 2019-11-19 DIAGNOSIS — F411 Generalized anxiety disorder: Secondary | ICD-10-CM | POA: Diagnosis not present

## 2019-11-19 DIAGNOSIS — G894 Chronic pain syndrome: Secondary | ICD-10-CM | POA: Diagnosis not present

## 2019-11-19 DIAGNOSIS — J90 Pleural effusion, not elsewhere classified: Secondary | ICD-10-CM | POA: Diagnosis not present

## 2019-11-19 DIAGNOSIS — R0602 Shortness of breath: Secondary | ICD-10-CM | POA: Diagnosis not present

## 2019-11-19 DIAGNOSIS — E1165 Type 2 diabetes mellitus with hyperglycemia: Secondary | ICD-10-CM | POA: Diagnosis not present

## 2019-11-19 DIAGNOSIS — R0989 Other specified symptoms and signs involving the circulatory and respiratory systems: Secondary | ICD-10-CM | POA: Diagnosis not present

## 2019-11-19 DIAGNOSIS — R062 Wheezing: Secondary | ICD-10-CM | POA: Diagnosis not present

## 2019-11-19 DIAGNOSIS — R059 Cough, unspecified: Secondary | ICD-10-CM | POA: Diagnosis not present

## 2019-11-19 DIAGNOSIS — I509 Heart failure, unspecified: Secondary | ICD-10-CM | POA: Diagnosis not present

## 2019-11-20 DIAGNOSIS — F331 Major depressive disorder, recurrent, moderate: Secondary | ICD-10-CM | POA: Diagnosis not present

## 2019-11-20 DIAGNOSIS — R0989 Other specified symptoms and signs involving the circulatory and respiratory systems: Secondary | ICD-10-CM | POA: Diagnosis not present

## 2019-11-20 DIAGNOSIS — R600 Localized edema: Secondary | ICD-10-CM | POA: Diagnosis not present

## 2019-11-20 DIAGNOSIS — I509 Heart failure, unspecified: Secondary | ICD-10-CM | POA: Diagnosis not present

## 2019-11-20 DIAGNOSIS — F411 Generalized anxiety disorder: Secondary | ICD-10-CM | POA: Diagnosis not present

## 2019-11-20 DIAGNOSIS — J449 Chronic obstructive pulmonary disease, unspecified: Secondary | ICD-10-CM | POA: Diagnosis not present

## 2019-11-20 DIAGNOSIS — R062 Wheezing: Secondary | ICD-10-CM | POA: Diagnosis not present

## 2019-12-30 DIAGNOSIS — D649 Anemia, unspecified: Secondary | ICD-10-CM | POA: Diagnosis not present

## 2020-01-15 DIAGNOSIS — F411 Generalized anxiety disorder: Secondary | ICD-10-CM | POA: Diagnosis not present

## 2020-01-15 DIAGNOSIS — F331 Major depressive disorder, recurrent, moderate: Secondary | ICD-10-CM | POA: Diagnosis not present

## 2020-02-04 DIAGNOSIS — F331 Major depressive disorder, recurrent, moderate: Secondary | ICD-10-CM | POA: Diagnosis not present

## 2020-02-04 DIAGNOSIS — F411 Generalized anxiety disorder: Secondary | ICD-10-CM | POA: Diagnosis not present

## 2020-04-05 DEATH — deceased

## 2020-11-05 IMAGING — DX DG CHEST 1V PORT
1 series · 1 of 1 positions shown · non-contrast
Comparison: Chest x-ray dated November 19, 2017.

CLINICAL DATA: Possible sepsis.

EXAM:
PORTABLE CHEST 1 VIEW

[chest ap]
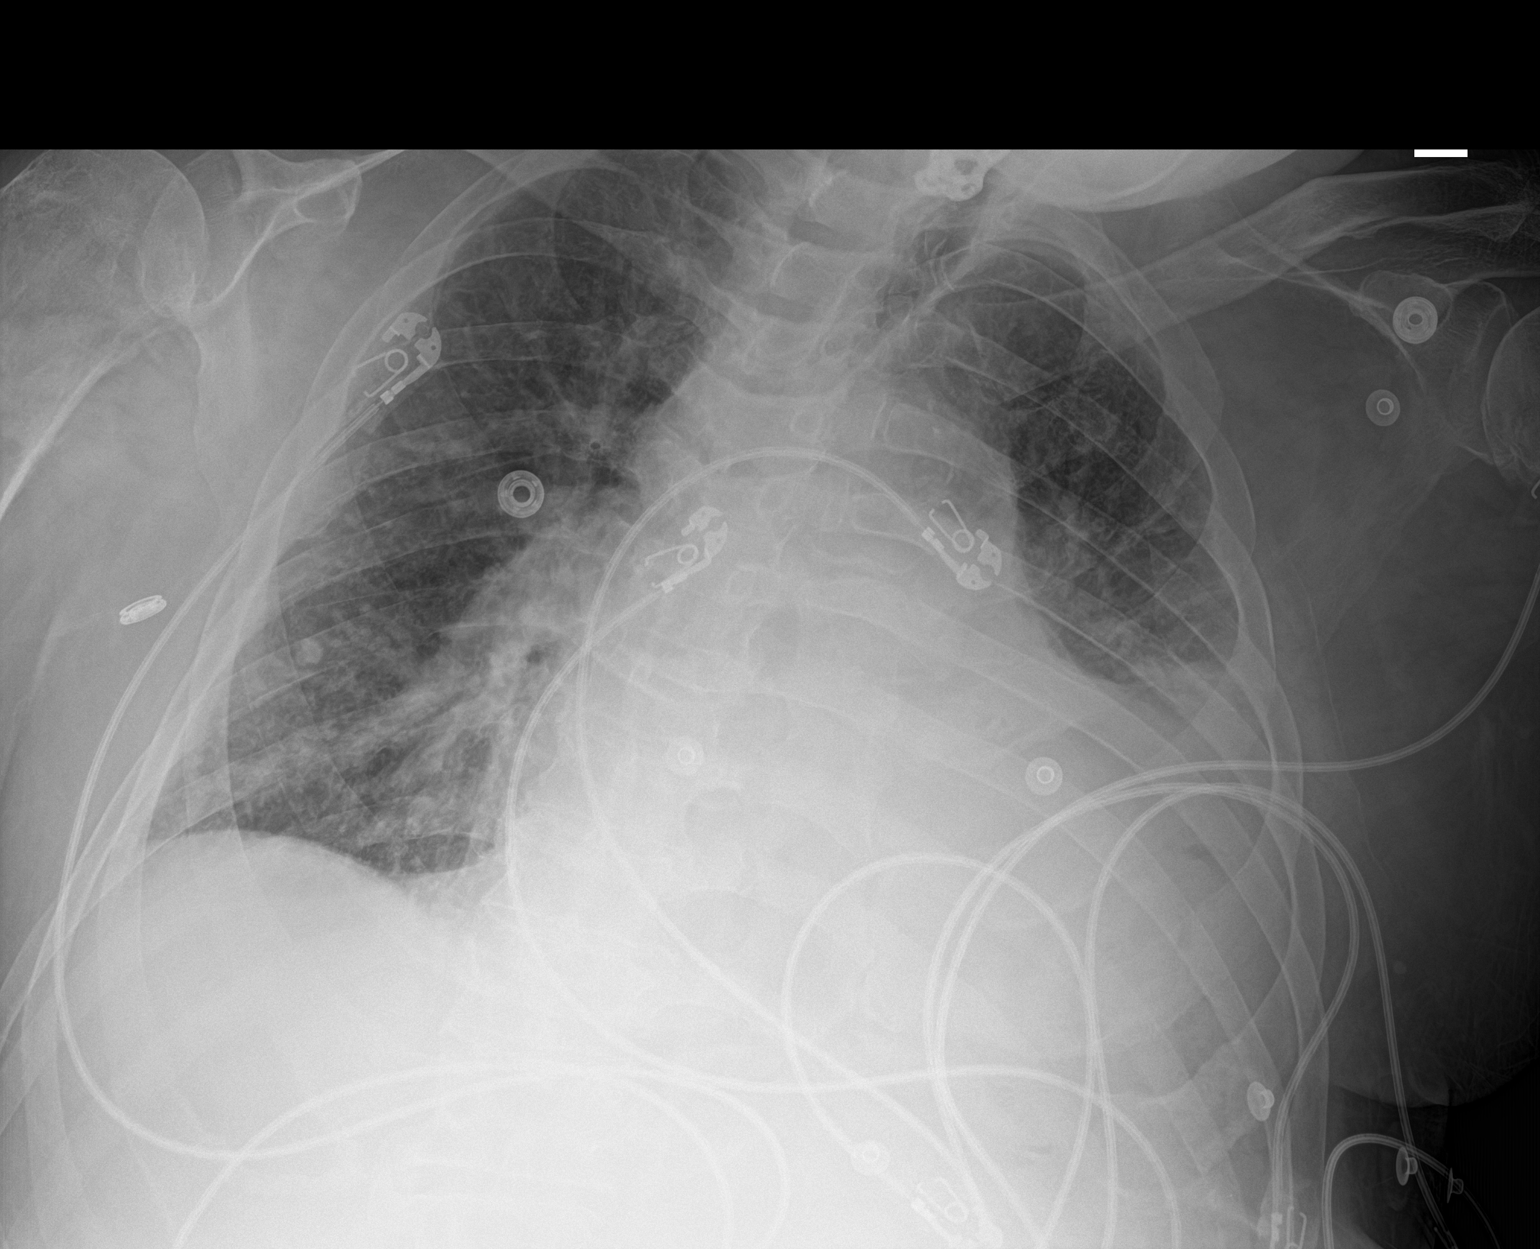

[1 of 1 positions shown; findings below may reference images not displayed]

FINDINGS: The patient is rotated to the left. Cardiomegaly. Increased
reticulonodular interstitial thickening at the right lung base.
Moderate left pleural effusion with dense retrocardiac opacity. No
pneumothorax. Unchanged calcified granuloma at the right lung base.
No acute osseous abnormality.
IMPRESSION: 1. Moderate left pleural effusion with left basilar atelectasis
versus infiltrate.
2. Increased reticulonodular interstitial thickening at the right
lung base may be infectious or inflammatory.
